# Patient Record
Sex: Male | Born: 1960 | Race: White | Hispanic: No | Marital: Married | Smoking: Never smoker
Health system: Southern US, Community
[De-identification: ages and names within clinical notes are randomized; demographics above are authoritative.]

## PROBLEM LIST (undated history)

## (undated) DIAGNOSIS — M549 Dorsalgia, unspecified: Secondary | ICD-10-CM

## (undated) DIAGNOSIS — G8929 Other chronic pain: Secondary | ICD-10-CM

## (undated) DIAGNOSIS — K219 Gastro-esophageal reflux disease without esophagitis: Secondary | ICD-10-CM

## (undated) DIAGNOSIS — M1990 Unspecified osteoarthritis, unspecified site: Secondary | ICD-10-CM

## (undated) DIAGNOSIS — R0602 Shortness of breath: Secondary | ICD-10-CM

## (undated) DIAGNOSIS — M5430 Sciatica, unspecified side: Secondary | ICD-10-CM

## (undated) DIAGNOSIS — R12 Heartburn: Secondary | ICD-10-CM

## (undated) DIAGNOSIS — I10 Essential (primary) hypertension: Secondary | ICD-10-CM

## (undated) DIAGNOSIS — I319 Disease of pericardium, unspecified: Secondary | ICD-10-CM

## (undated) DIAGNOSIS — E7800 Pure hypercholesterolemia, unspecified: Secondary | ICD-10-CM

## (undated) DIAGNOSIS — R7303 Prediabetes: Secondary | ICD-10-CM

## (undated) DIAGNOSIS — E663 Overweight: Secondary | ICD-10-CM

## (undated) DIAGNOSIS — M25559 Pain in unspecified hip: Secondary | ICD-10-CM

## (undated) DIAGNOSIS — M2550 Pain in unspecified joint: Secondary | ICD-10-CM

## (undated) DIAGNOSIS — F909 Attention-deficit hyperactivity disorder, unspecified type: Secondary | ICD-10-CM

## (undated) HISTORY — DX: Unspecified osteoarthritis, unspecified site: M19.90

## (undated) HISTORY — DX: Prediabetes: R73.03

## (undated) HISTORY — DX: Shortness of breath: R06.02

## (undated) HISTORY — DX: Dorsalgia, unspecified: M54.9

## (undated) HISTORY — DX: Overweight: E66.3

## (undated) HISTORY — PX: OTHER SURGICAL HISTORY: SHX169

## (undated) HISTORY — PX: UVULECTOMY: SHX2631

## (undated) HISTORY — DX: Attention-deficit hyperactivity disorder, unspecified type: F90.9

## (undated) HISTORY — DX: Heartburn: R12

## (undated) HISTORY — DX: Pain in unspecified hip: M25.559

## (undated) HISTORY — DX: Pain in unspecified joint: M25.50

---
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
## 1979-04-24 HISTORY — PX: WISDOM TOOTH EXTRACTION: SHX21

## 1998-04-23 DIAGNOSIS — I319 Disease of pericardium, unspecified: Secondary | ICD-10-CM

## 1998-04-23 DIAGNOSIS — Z8679 Personal history of other diseases of the circulatory system: Secondary | ICD-10-CM

## 1998-04-23 HISTORY — DX: Personal history of other diseases of the circulatory system: Z86.79

## 1998-04-23 HISTORY — DX: Disease of pericardium, unspecified: I31.9

## 2012-01-16 ENCOUNTER — Other Ambulatory Visit (HOSPITAL_BASED_OUTPATIENT_CLINIC_OR_DEPARTMENT_OTHER): Payer: Self-pay | Admitting: Family Medicine

## 2012-01-16 DIAGNOSIS — M5430 Sciatica, unspecified side: Secondary | ICD-10-CM

## 2012-01-18 ENCOUNTER — Other Ambulatory Visit (HOSPITAL_BASED_OUTPATIENT_CLINIC_OR_DEPARTMENT_OTHER): Payer: Self-pay | Admitting: Family Medicine

## 2012-01-18 DIAGNOSIS — Z1389 Encounter for screening for other disorder: Secondary | ICD-10-CM

## 2012-01-19 ENCOUNTER — Ambulatory Visit (HOSPITAL_BASED_OUTPATIENT_CLINIC_OR_DEPARTMENT_OTHER)
Admission: RE | Admit: 2012-01-19 | Discharge: 2012-01-19 | Disposition: A | Discharge status: Home / Self Care | Payer: Managed Care, Other (non HMO) | Source: Ambulatory Visit | Attending: Family Medicine | Admitting: Family Medicine

## 2012-01-19 DIAGNOSIS — Z1389 Encounter for screening for other disorder: Secondary | ICD-10-CM

## 2012-01-19 DIAGNOSIS — M5430 Sciatica, unspecified side: Secondary | ICD-10-CM

## 2012-01-19 DIAGNOSIS — M5126 Other intervertebral disc displacement, lumbar region: Secondary | ICD-10-CM | POA: Insufficient documentation

## 2015-10-28 ENCOUNTER — Other Ambulatory Visit: Payer: Self-pay | Admitting: Sports Medicine

## 2015-10-28 DIAGNOSIS — M25512 Pain in left shoulder: Principal | ICD-10-CM

## 2015-10-28 DIAGNOSIS — M25511 Pain in right shoulder: Secondary | ICD-10-CM

## 2016-04-23 HISTORY — PX: NASAL SINUS SURGERY: SHX719

## 2017-04-23 HISTORY — PX: COLONOSCOPY: SHX174

## 2017-05-28 HISTORY — PX: ROTATOR CUFF REPAIR: SHX139

## 2017-06-10 ENCOUNTER — Other Ambulatory Visit: Payer: Self-pay

## 2017-06-10 ENCOUNTER — Emergency Department (HOSPITAL_BASED_OUTPATIENT_CLINIC_OR_DEPARTMENT_OTHER): Payer: Commercial Managed Care - PPO

## 2017-06-10 ENCOUNTER — Encounter (HOSPITAL_BASED_OUTPATIENT_CLINIC_OR_DEPARTMENT_OTHER): Payer: Self-pay

## 2017-06-10 ENCOUNTER — Emergency Department (HOSPITAL_BASED_OUTPATIENT_CLINIC_OR_DEPARTMENT_OTHER)
Admission: EM | Admit: 2017-06-10 | Discharge: 2017-06-10 | Discharge status: Against Medical Advice | Payer: Commercial Managed Care - PPO | Attending: Emergency Medicine | Admitting: Emergency Medicine

## 2017-06-10 DIAGNOSIS — R0602 Shortness of breath: Secondary | ICD-10-CM | POA: Diagnosis not present

## 2017-06-10 DIAGNOSIS — R079 Chest pain, unspecified: Secondary | ICD-10-CM | POA: Insufficient documentation

## 2017-06-10 DIAGNOSIS — Z79899 Other long term (current) drug therapy: Secondary | ICD-10-CM | POA: Diagnosis not present

## 2017-06-10 HISTORY — DX: Sciatica, unspecified side: M54.30

## 2017-06-10 HISTORY — DX: Pure hypercholesterolemia, unspecified: E78.00

## 2017-06-10 HISTORY — DX: Gastro-esophageal reflux disease without esophagitis: K21.9

## 2017-06-10 HISTORY — DX: Disease of pericardium, unspecified: I31.9

## 2017-06-10 LAB — BASIC METABOLIC PANEL
Anion gap: 8 (ref 5–15)
BUN: 18 mg/dL (ref 6–20)
CALCIUM: 8.8 mg/dL — AB (ref 8.9–10.3)
CHLORIDE: 103 mmol/L (ref 101–111)
CO2: 30 mmol/L (ref 22–32)
CREATININE: 1.1 mg/dL (ref 0.61–1.24)
GFR calc non Af Amer: >60 mL/min (ref >60)
Glucose, Bld: 71 mg/dL (ref 65–99)
Potassium: 3.8 mmol/L (ref 3.5–5.1)
SODIUM: 141 mmol/L (ref 135–145)

## 2017-06-10 LAB — CBC
HCT: 43.7 % (ref 39.0–52.0)
Hemoglobin: 14.9 g/dL (ref 13.0–17.0)
MCH: 29.7 pg (ref 26.0–34.0)
MCHC: 34.1 g/dL (ref 30.0–36.0)
MCV: 87.2 fL (ref 78.0–100.0)
PLATELETS: 158 10*3/uL (ref 150–400)
RBC: 5.01 MIL/uL (ref 4.22–5.81)
RDW: 12.5 % (ref 11.5–15.5)
WBC: 5.9 10*3/uL (ref 4.0–10.5)

## 2017-06-10 LAB — D-DIMER, QUANTITATIVE: D-Dimer, Quant: 0.62 ug/mL-FEU — ABNORMAL HIGH (ref 0.00–0.50)

## 2017-06-10 LAB — TROPONIN I: TROPONIN I: 0.03 ng/mL — AB (ref <0.03)

## 2017-06-10 MED ORDER — IOPAMIDOL (ISOVUE-370) INJECTION 76%
100.0000 mL | Freq: Once | INTRAVENOUS | Status: AC | PRN
Start: 2017-06-10 — End: 2017-06-10
  Administered 2017-06-10: 100 mL via INTRAVENOUS

## 2017-06-10 NOTE — ED Notes (Signed)
Date and time results received: 06/10/17 1939   Test: troponin Critical Value: 0.03  Name of Provider Notified: Gwendalyn EgeLindsay Layden PA  Orders Received? Or Actions Taken?: no further orders received

## 2017-06-10 NOTE — ED Notes (Signed)
Pt and family member left 

## 2017-06-10 NOTE — ED Notes (Signed)
ED Provider at bedside. 

## 2017-06-10 NOTE — ED Notes (Signed)
Patient stated that after right ORIF last Friday, he feels uncomfortable on his chest when he is laying down at night.  He feels okay when just sitting up.

## 2017-06-10 NOTE — ED Notes (Signed)
Informed by AD, Crystal that patient left AMA.  I was with the patient and wife at bedside to get the order for Troponin and they never mentioned that they want to go home.

## 2017-06-10 NOTE — ED Provider Notes (Signed)
[facility] EMERGENCY DEPARTMENT Provider Note   CSN: [id number] Arrival date & time: 06/10/17  1502     History   Chief Complaint Chief Complaint  Patient presents with  . Chest Pain    HPI Donald Hubbard is a 57 y.o. male possible history of GERD, hypercholesterolemia, pericarditis who presents for evaluation of left-sided chest discomfort that has been ongoing for the last 3 days.  Patient reports that it is a discomfort in the left side of his chest that does not radiate.  He states that he feels like he has to press down on it to make it better.  He states that the discomfort is worse with deep inspiration and feels like "he cannot get enough breath in."  He states pain is worsened with sitting down and improved with sitting up. He states that the pain is not worse with exertion.  He has not had any associated nausea, diaphoresis.  He states that he has been unable to sleep secondary to the pain.  The only medication he has taken is some diazepam which he states has not helped that much.  Patient reports that he had rotator cuff surgery 4 days ago prior to onset of symptoms.  Patient patient denies any history of blood clots.  Patient denies any fevers, abdominal pain, vomiting, leg swelling.  Patient reports no personal cardiac history.  He does not have any high blood pressure or diabetes.  He does have high cholesterol.  He reports that both of his grandparents died of heart disease in their 59s and 43s.  Patient is on a current smoker.  The history is provided by the patient.    Past Medical History:  Diagnosis Date  . GERD (gastroesophageal reflux disease)   . High cholesterol   . Pericarditis   . Sciatic nerve pain     There are no active problems to display for this patient.   Past Surgical History:  Procedure Laterality Date  . ROTATOR CUFF REPAIR      Home Medications    Prior to Admission medications   Medication Sig Start Date End Date Taking?  Authorizing Provider  DiazePAM (VALIUM PO) Take by mouth.   Yes [provider]  HYDROcodone-acetaminophen (NORCO/VICODIN) 5-325 MG tablet Take 1 tablet by mouth every 6 (six) hours as needed for moderate pain.   Yes [provider]  OMEPRAZOLE PO Take by mouth.   Yes [provider]    Family History No family history on file.  Social History Social History   Tobacco Use  . Smoking status: Never Smoker  . Smokeless tobacco: Never Used  Substance Use Topics  . Alcohol use: Yes    Comment: daily  . Drug use: No     Allergies   Patient has no known allergies.   Review of Systems Review of Systems  Constitutional: Negative for fever.  Respiratory: Positive for shortness of breath. Negative for cough.   Cardiovascular: Positive for chest pain. Negative for leg swelling.  Gastrointestinal: Negative for abdominal pain, diarrhea, nausea and vomiting.  Genitourinary: Negative for dysuria and hematuria.  Musculoskeletal: Negative for back pain and neck pain.  Skin: Negative for color change and rash.  Neurological: Negative for dizziness, weakness, numbness and headaches.  All other systems reviewed and are negative.    Physical Exam Updated Vital Signs BP (!) 157/78 (BP Location: Left Arm)   Pulse (!) 57   Temp 98.5 F (36.9 C) (Oral)   Resp 18  Ht 5\' 7"  (1.702 m)   Wt 73.5 kg (162 lb)   SpO2 100%   BMI 25.37 kg/m   Physical Exam  Constitutional: He is oriented to person, place, and time. He appears well-developed and well-nourished.  HENT:  Head: Normocephalic and atraumatic.  Mouth/Throat: Oropharynx is clear and moist and mucous membranes are normal.  Eyes: Conjunctivae, EOM and lids are normal. Pupils are equal, round, and reactive to light.  Neck: Full passive range of motion without pain.  Cardiovascular: Normal rate, regular rhythm, normal heart sounds and normal pulses. Exam reveals no gallop and no friction rub.  No murmur  heard. Pulses:      Radial pulses are 2+ on the right side, and 2+ on the left side.  Pulmonary/Chest: Effort normal and breath sounds normal.  No evidence of respiratory distress. Able to speak in full sentences without difficulty.  No tenderness palpation to the anterior chest wall.  Pain is not repeated use with palpation or movement of the upper extremities.  Abdominal: Soft. Normal appearance. There is no tenderness. There is no rigidity and no guarding.  Musculoskeletal: Normal range of motion.  Surgical incision site noted to the right shoulder.  No surrounding warmth, erythema, active drainage.  Neurological: He is alert and oriented to person, place, and time.  Skin: Skin is warm and dry. Capillary refill takes less than 2 seconds.  Psychiatric: He has a normal mood and affect. His speech is normal.  Nursing note and vitals reviewed.    ED Treatments / Results  Labs (all labs ordered are listed, but only abnormal results are displayed) Labs Reviewed  BASIC METABOLIC PANEL - Abnormal; Notable for the following components:      Result Value   Calcium 8.8 (*)    All other components within normal limits  D-DIMER, QUANTITATIVE (NOT AT [facility]) - Abnormal; Notable for the following components:   D-Dimer, Quant 0.62 (*)    All other components within normal limits  TROPONIN I - Abnormal; Notable for the following components:   Troponin I 0.03 (*)    All other components within normal limits  CBC  TROPONIN I    EKG  EKG Interpretation  Date/Time:  Monday June 10 2017 15:17:28 EST Ventricular Rate:  61 PR Interval:  134 QRS Duration: 86 QT Interval:  434 QTC Calculation: 436 R Axis:   47 Text Interpretation:  Normal sinus rhythm Normal ECG No prior ECG for comparison.  NO STEMI Confirmed by Theda Belfast ([id number]) on 06/10/2017 7:26:01 PM       Radiology Dg Chest 2 View  Result Date: 06/10/2017 CLINICAL DATA:  Pt had right shoulder surgery 4 days ago, unable to move  arm away from body much for lateral view. Pt has been having chest pressure to central and left side of chest since surgery. H/o pericarditis, GERD. EXAM: CHEST  2 VIEW COMPARISON:  None. FINDINGS: The heart size and mediastinal contours are within normal limits. Both lungs are clear. The visualized skeletal structures are unremarkable. IMPRESSION: No active cardiopulmonary disease. Electronically Signed   By: Norva Pavlov M.D.   On: 06/10/2017 15:38   Ct Angio Chest Pe W Or Wo Contrast  Result Date: 06/10/2017 CLINICAL DATA:  History of right shoulder surgery 4 days ago. Chest pain since surgery. EXAM: CT ANGIOGRAPHY CHEST WITH CONTRAST TECHNIQUE: Multidetector CT imaging of the chest was performed using the standard protocol during bolus administration of intravenous contrast. Multiplanar CT image reconstructions and MIPs were obtained to  evaluate the vascular anatomy. CONTRAST:  ISOVUE-370 IOPAMIDOL (ISOVUE-370) INJECTION 76% COMPARISON:  Chest x-ray 06/10/2017 FINDINGS: Cardiovascular: The heart is normal in size. No pericardial effusion. The aorta is normal in caliber. No dissection. No atherosclerotic calcifications. No definite coronary artery calcifications. The pulmonary arterial tree is well opacified. No filling defects to suggest pulmonary embolism. Mediastinum/Nodes: No mediastinal or hilar mass or lymphadenopathy. Small scattered lymph nodes are noted. The esophagus is grossly normal. Lungs/Pleura: The lungs are clear. No infiltrates, edema or effusions. No pneumothorax. No interstitial lung disease or bronchiectasis. Upper Abdomen: No significant upper abdominal findings. Musculoskeletal: No chest wall lesions. No supraclavicular or axillary adenopathy. Small amount of gas is noted in the right subacromial bursa, around the biceps tendon and in the joint consistent with recent surgery. Review of the MIP images confirms the above findings. IMPRESSION: 1. No CT findings for pulmonary  embolism. 2. Normal thoracic aorta. 3. No acute pulmonary findings. 4. Postoperative changes involving the right shoulder. Electronically Signed   By: Rudie Meyer M.D.   On: 06/10/2017 18:20    Procedures Procedures (including critical care time)  Medications Ordered in ED Medications  iopamidol (ISOVUE-370) 76 % injection 100 mL (100 mLs Intravenous Contrast Given 06/10/17 1742)     Initial Impression / Assessment and Plan / ED Course  I have reviewed the triage vital signs and the nursing notes.  Pertinent labs & imaging results that were available during my care of the patient were reviewed by me and considered in my medical decision making (see chart for details).     57 year old male who presents for evaluation of chest pain that has been ongoing for the last 3 days.  He is status post rotator cuff surgery 4 days ago prior to onset of symptoms.  Pain is worse with deep inspiration and he feels like he cannot get enough breath in.  He denies any exertional component to the chest pain.  Denies any nausea, fever, diaphoresis.  No personal cardiac history.  Does have family members who died of heart disease in their 56s and 25s.  He does have a history of high cholesterol.  No hypertension, diabetes. Patient is afebrile, non-toxic appearing, sitting comfortably on examination table. Vital signs reviewed and stable.  Consider PE versus ACS etiology versus acute infectious etiology versus symptomatic anemia vs pericarditis. Initial labs ordered at triage.  Initial troponin is negative.  CBC shows stable hemoglobin and hematocrit.  BMP is unremarkable.  D-dimer is elevated.  Given positive d-dimer, will plan for CTA of chest for evaluation of PE.  Chest x-ray negative for any acute infectious etiology.  EKG shows normal sinus rhythm, rate 61.  No previous for comparisons.  Given patient's history and risk factors, he has a HEART score of 2 and is therefore low risk for acute cardiac event. Plan  for delta troponin.   CTA of chest is negative for any acute pulmonary embolism.  Delta troponin pending.  Updated patient and family on plan.  We will plan to repeat troponin at 3-hour mark.  Patient reports pain is improved in the ED.    Family requesting update from Diplomatic Services operational officer. Explained to family that delta will discuss treatment options with patient.  If patient wishes troponin is pending. Will update when results are back.   Delta troponin is 0.03.  His initial troponin had been at less than 0.03.   Went to patient's room to update him on delta troponin.  Patient was out in the room.  Secretary saw patient and family walk out of the room prior to discussion of results.   Final Clinical Impressions(s) / ED Diagnoses   Final diagnoses:  Chest pain, unspecified type    ED Discharge Orders    None       Rosana HoesLayden, Lindsey A, PA-C 06/11/17 2135    Tegeler, Canary Brimhristopher J, MD 06/12/17 [phone]

## 2017-06-10 NOTE — ED Triage Notes (Signed)
C/o left side CP started after right rotator cuff surgery 2.15-NAD-steady gait-has right shoulder immobilizer in place

## 2018-04-23 HISTORY — PX: BREAST LUMPECTOMY: SHX2

## 2018-07-29 DIAGNOSIS — M5030 Other cervical disc degeneration, unspecified cervical region: Secondary | ICD-10-CM | POA: Diagnosis not present

## 2018-07-29 DIAGNOSIS — M5033 Other cervical disc degeneration, cervicothoracic region: Secondary | ICD-10-CM | POA: Diagnosis not present

## 2019-09-23 ENCOUNTER — Other Ambulatory Visit: Payer: Self-pay | Admitting: Family Medicine

## 2019-09-23 DIAGNOSIS — N63 Unspecified lump in unspecified breast: Secondary | ICD-10-CM

## 2019-09-30 ENCOUNTER — Ambulatory Visit: Payer: Commercial Managed Care - PPO

## 2019-09-30 ENCOUNTER — Other Ambulatory Visit: Payer: Self-pay

## 2019-09-30 ENCOUNTER — Ambulatory Visit
Admission: RE | Admit: 2019-09-30 | Discharge: 2019-09-30 | Disposition: A | Discharge status: Home / Self Care | Payer: Commercial Managed Care - PPO | Source: Ambulatory Visit | Attending: Family Medicine | Admitting: Family Medicine

## 2019-09-30 DIAGNOSIS — N63 Unspecified lump in unspecified breast: Secondary | ICD-10-CM

## 2019-10-23 ENCOUNTER — Ambulatory Visit: Payer: Self-pay | Admitting: Surgery

## 2019-10-23 NOTE — H&P (Signed)
History of Present Illness Donald Hubbard. Donald Keimig MD; 10/23/2019 12:38 PM) The patient is a 59 year old male who presents with a complaint of Mass. This is a healthy 59 year old male who presents with a 2 year history of slowly enlarging tender left breast mass. The patient states that he has intermittently noticed some tenderness in the left breast in the upper outer quadrant. This has become more noticeable more severe recently. He can palpate a firm tender mass in this area. He underwent mammogram that was unremarkable. No biopsies were performed. Due to the ongoing tenderness and palpable mass, he presents now to discuss excision.  No family history of breast cancer.  CLINICAL DATA: 59 year old male with chronic fullness and pain in the RETROAREOLAR LEFT breast, recently increased.  EXAM: DIGITAL DIAGNOSTIC BILATERAL MAMMOGRAM WITH CAD AND TOMO  COMPARISON: None  ACR Breast Density Category b: There are scattered areas of fibroglandular density.  FINDINGS: 2D/3D full field views of both breasts and spot compression view of the LEFT breast demonstrate a moderate amount of normal fibroglandular tissue in the RETROAREOLAR LEFT breast compatible with gynecomastia.  Minimal gynecomastia in the RETROAREOLAR RIGHT breast noted.  No suspicious mass, distortion or worrisome calcifications are noted.  Mammographic images were processed with CAD.  On physical examination, fullness in the LEFT RETROAREOLAR region identified without discrete palpable mass.  IMPRESSION: 1. Moderate LEFT gynecomastia and minimal RIGHT gynecomastia. 2. No mammographic evidence of breast malignancy.  RECOMMENDATION: Surgical consultation as the patient desires possible excision due to pain and fullness.  I have discussed the findings and recommendations with the patient. If applicable, a reminder letter will be sent to the patient regarding the next appointment.  BI-RADS CATEGORY 2:  Benign.  Electronically Signed: By: Donald Hubbard M.D. On: 09/30/2019 10:45   Problem List/Past Medical Donald Hubbard K. Bibiana Gillean, MD; 10/23/2019 12:38 PM) LEFT BREAST MASS (N63.20) BREAST TENDERNESS IN MALE (N64.4)  Past Surgical History Donald Hubbard, Donald Hubbard; 10/23/2019 9:21 AM) Colon Polyp Removal - Colonoscopy Shoulder Surgery Right.  Allergies Donald Hubbard, Donald Hubbard; 10/23/2019 9:22 AM) No Known Drug Allergies [10/23/2019]:  Medication History Donald Hubbard, Hubbard; 10/23/2019 9:24 AM) Acyclovir (800MG  Tablet, Oral) Active. Ezetimibe (10MG  Tablet, Oral) Active. ALPRAZolam (0.5MG  Tablet, Oral) Active. Lansoprazole (30MG  Capsule DR, Oral) Active. Adderall XR (10MG  Capsule ER 24HR, Oral) Active. HYDROcodone-Acetaminophen (5-325MG  Tablet, Oral) Active. diazePAM (5MG  Tablet, Oral) Active.  Social History [place], ; 10/23/2019 9:21 AM) Alcohol use Moderate alcohol use. Caffeine use Coffee. No drug use  Family History , Donald Hubbard; 10/23/2019 9:21 AM) Hypertension Mother.  Other Problems Donald Hubbard. Donald Imhof, MD; 10/23/2019 12:38 PM) Arthritis Back Pain Gastroesophageal Reflux Disease High blood pressure Hypercholesterolemia     Review of Systems Donald Hubbard Donald Hubbard; 10/23/2019 9:21 AM) General Not Present- Appetite Loss, Chills, Fatigue, Fever, Night Sweats, Weight Gain and Weight Loss. Skin Not Present- Change in Wart/Mole, Dryness, Hives, Jaundice, New Lesions, Non-Healing Wounds, Rash and Ulcer. HEENT Not Present- Earache, Hearing Loss, Hoarseness, Nose Bleed, Oral Ulcers, Ringing in the Ears, Seasonal Allergies, Sinus Pain, Sore Throat, Visual Disturbances, Wears glasses/contact lenses and Yellow Eyes. Respiratory Not Present- Bloody sputum, Chronic Cough, Difficulty Breathing, Snoring and Wheezing. Breast Present- Breast Mass. Not Present- Breast Pain, Nipple Discharge and Skin Changes. Cardiovascular Not Present- Chest Pain, Difficulty Breathing Lying Down,  Leg Cramps, Palpitations, Rapid Heart Rate, Shortness of Breath and Swelling of Extremities. Gastrointestinal Not Present- Abdominal Pain, Bloating, Bloody Stool, Change in Bowel Habits, Chronic diarrhea, Constipation, Difficulty Swallowing, Excessive gas, Gets full quickly at  meals, Hemorrhoids, Indigestion, Nausea, Rectal Pain and Vomiting. Male Genitourinary Not Present- Blood in Urine, Change in Urinary Stream, Frequency, Impotence, Nocturia, Painful Urination, Urgency and Urine Leakage. Musculoskeletal Not Present- Back Pain, Joint Pain, Joint Stiffness, Muscle Pain, Muscle Weakness and Swelling of Extremities. Neurological Not Present- Decreased Memory, Fainting, Headaches, Numbness, Seizures, Tingling, Tremor, Trouble walking and Weakness. Psychiatric Not Present- Anxiety, Bipolar, Change in Sleep Pattern, Depression, Fearful and Frequent crying. Endocrine Not Present- Cold Intolerance, Excessive Hunger, Hair Changes, Heat Intolerance, Hot flashes and New Diabetes. Hematology Not Present- Blood Thinners, Easy Bruising, Excessive bleeding, Gland problems, HIV and Persistent Infections.  Vitals Donald Hubbard Donald Hubbard; 10/23/2019 9:24 AM) 10/23/2019 9:24 AM Weight: 167.38 lb Height: 67in Body Surface Area: 1.87 m Body Mass Index: 26.21 kg/m  Temp.: 98.48F  Pulse: 77 (Regular)  BP: 134/80(Sitting, Left Arm, Standard)        Physical Exam Donald Hubbard K. Donald Bier MD; 10/23/2019 12:39 PM)  The physical exam findings are as follows: Note:Constitutional: WDWN in NAD, conversant, no obvious deformities; resting comfortably Eyes: Pupils equal, round; sclera anicteric; moist conjunctiva; no lid lag HENT: Oral mucosa moist; good dentition Neck: No masses palpated, trachea midline; no thyromegaly Lungs: CTA bilaterally; normal respiratory effort Breasts: Minimal asymmetry with the left side slightly greater than the right. However beginning in the left upper lateral retroareolar region  extending up into the upper outer quadrant, there is a area of firmness that measures about 3 cm in diameter. This appears to be about a centimeter or 1.5 cm in thickness. This areas moderately tender to palpation. There are no overlying skin changes. No drainage from the nipple. CV: Regular rate and rhythm; no murmurs; extremities well-perfused with no edema Abd: +bowel sounds, soft, non-tender, no palpable organomegaly; no palpable hernias Musc: Normal gait; no apparent clubbing or cyanosis in extremities Lymphatic: No palpable cervical or axillary lymphadenopathy Skin: Warm, dry; no sign of jaundice Psychiatric - alert and oriented x 4; calm mood and affect    Assessment & Plan Donald Hubbard K. Alee Gressman MD; 10/23/2019 12:37 PM)  LEFT BREAST MASS (N63.20)   BREAST TENDERNESS IN MALE (N64.4)  Current Plans Schedule for Surgery - Left subcutaneous mastectomy. The surgical procedure has been discussed with the patient. Potential risks, benefits, alternative treatments, and expected outcomes have been explained. All of the patient's questions at this time have been answered. The likelihood of reaching the patient's treatment goal is good. The patient understand the proposed surgical procedure and wishes to proceed.  Donald Hubbard. Donald Skains, MD, [facility] Surgery  General/ Trauma Surgery   10/23/2019 12:40 PM

## 2019-11-25 ENCOUNTER — Other Ambulatory Visit: Payer: Self-pay

## 2019-11-25 ENCOUNTER — Encounter (HOSPITAL_BASED_OUTPATIENT_CLINIC_OR_DEPARTMENT_OTHER): Payer: Self-pay | Admitting: Surgery

## 2019-11-25 NOTE — Progress Notes (Signed)
Chart reviewed with Dr. Hyacinth Meeker, ok to proceed as scheduled.

## 2019-11-30 ENCOUNTER — Other Ambulatory Visit (HOSPITAL_COMMUNITY)
Admission: RE | Admit: 2019-11-30 | Discharge: 2019-11-30 | Disposition: A | Discharge status: Home / Self Care | Payer: Commercial Managed Care - PPO | Source: Ambulatory Visit | Attending: Surgery | Admitting: Surgery

## 2019-11-30 DIAGNOSIS — Z01812 Encounter for preprocedural laboratory examination: Secondary | ICD-10-CM | POA: Diagnosis present

## 2019-11-30 DIAGNOSIS — Z20822 Contact with and (suspected) exposure to covid-19: Secondary | ICD-10-CM | POA: Insufficient documentation

## 2019-11-30 LAB — SARS CORONAVIRUS 2 (TAT 6-24 HRS): SARS Coronavirus 2: NEGATIVE

## 2019-11-30 NOTE — Progress Notes (Signed)

## 2019-12-03 ENCOUNTER — Encounter (HOSPITAL_BASED_OUTPATIENT_CLINIC_OR_DEPARTMENT_OTHER): Payer: Self-pay | Admitting: Surgery

## 2019-12-03 ENCOUNTER — Other Ambulatory Visit: Payer: Self-pay

## 2019-12-03 ENCOUNTER — Ambulatory Visit (HOSPITAL_BASED_OUTPATIENT_CLINIC_OR_DEPARTMENT_OTHER): Payer: Commercial Managed Care - PPO | Admitting: Anesthesiology

## 2019-12-03 ENCOUNTER — Encounter (HOSPITAL_BASED_OUTPATIENT_CLINIC_OR_DEPARTMENT_OTHER)
Admission: RE | Disposition: A | Discharge status: Home / Self Care | Payer: Self-pay | Source: Home / Self Care | Attending: Surgery

## 2019-12-03 ENCOUNTER — Ambulatory Visit (HOSPITAL_BASED_OUTPATIENT_CLINIC_OR_DEPARTMENT_OTHER)
Admission: RE | Admit: 2019-12-03 | Discharge: 2019-12-03 | Disposition: A | Discharge status: Home / Self Care | Payer: Commercial Managed Care - PPO | Attending: Surgery | Admitting: Surgery

## 2019-12-03 DIAGNOSIS — K219 Gastro-esophageal reflux disease without esophagitis: Secondary | ICD-10-CM | POA: Diagnosis not present

## 2019-12-03 DIAGNOSIS — I10 Essential (primary) hypertension: Secondary | ICD-10-CM | POA: Insufficient documentation

## 2019-12-03 DIAGNOSIS — N6321 Unspecified lump in the left breast, upper outer quadrant: Secondary | ICD-10-CM | POA: Diagnosis not present

## 2019-12-03 DIAGNOSIS — Z79899 Other long term (current) drug therapy: Secondary | ICD-10-CM | POA: Diagnosis not present

## 2019-12-03 DIAGNOSIS — N6320 Unspecified lump in the left breast, unspecified quadrant: Secondary | ICD-10-CM | POA: Diagnosis present

## 2019-12-03 DIAGNOSIS — Z79891 Long term (current) use of opiate analgesic: Secondary | ICD-10-CM | POA: Diagnosis not present

## 2019-12-03 DIAGNOSIS — N62 Hypertrophy of breast: Secondary | ICD-10-CM | POA: Insufficient documentation

## 2019-12-03 HISTORY — PX: BREAST BIOPSY: SHX20

## 2019-12-03 HISTORY — DX: Essential (primary) hypertension: I10

## 2019-12-03 HISTORY — DX: Other chronic pain: G89.29

## 2019-12-03 SURGERY — BREAST BIOPSY
Anesthesia: General | Site: Breast | Laterality: Left

## 2019-12-03 MED ORDER — HYDROMORPHONE HCL 1 MG/ML IJ SOLN
0.2500 mg | INTRAMUSCULAR | Status: DC | PRN
  Administered 2019-12-03: 0.25 mg via INTRAVENOUS

## 2019-12-03 MED ORDER — 0.9 % SODIUM CHLORIDE (POUR BTL) OPTIME
TOPICAL | Status: DC | PRN
  Administered 2019-12-03: 200 mL

## 2019-12-03 MED ORDER — GABAPENTIN 300 MG PO CAPS
ORAL_CAPSULE | ORAL | Status: AC
  Filled 2019-12-03: qty 1

## 2019-12-03 MED ORDER — PROPOFOL 10 MG/ML IV BOLUS
INTRAVENOUS | Status: DC | PRN
  Administered 2019-12-03: 150 mg via INTRAVENOUS

## 2019-12-03 MED ORDER — ACETAMINOPHEN 500 MG PO TABS
ORAL_TABLET | ORAL | Status: AC
  Filled 2019-12-03: qty 2

## 2019-12-03 MED ORDER — OXYCODONE HCL 5 MG/5ML PO SOLN: 5.0000 mg | Freq: Once | ORAL | Status: AC | PRN

## 2019-12-03 MED ORDER — OXYCODONE HCL 5 MG PO TABS
5.0000 mg | ORAL_TABLET | Freq: Once | ORAL | Status: AC | PRN
  Administered 2019-12-03: 5 mg via ORAL

## 2019-12-03 MED ORDER — CEFAZOLIN SODIUM-DEXTROSE 2-4 GM/100ML-% IV SOLN
2.0000 g | INTRAVENOUS | Status: AC
  Administered 2019-12-03: 2 g via INTRAVENOUS

## 2019-12-03 MED ORDER — FENTANYL CITRATE (PF) 100 MCG/2ML IJ SOLN
100.0000 ug | Freq: Once | INTRAMUSCULAR | Status: AC
  Administered 2019-12-03: 100 ug via INTRAVENOUS

## 2019-12-03 MED ORDER — DEXAMETHASONE SODIUM PHOSPHATE 10 MG/ML IJ SOLN
INTRAMUSCULAR | Status: DC | PRN
  Administered 2019-12-03: 10 mg via INTRAVENOUS

## 2019-12-03 MED ORDER — OXYCODONE HCL 5 MG PO TABS
5.0000 mg | ORAL_TABLET | Freq: Four times a day (QID) | ORAL | 0 refills | Status: DC | PRN
Start: 2019-12-03 — End: 2021-07-20

## 2019-12-03 MED ORDER — LACTATED RINGERS IV SOLN: INTRAVENOUS | Status: DC

## 2019-12-03 MED ORDER — MIDAZOLAM HCL 2 MG/2ML IJ SOLN
INTRAMUSCULAR | Status: AC
  Filled 2019-12-03: qty 2

## 2019-12-03 MED ORDER — MIDAZOLAM HCL 2 MG/2ML IJ SOLN
2.0000 mg | Freq: Once | INTRAMUSCULAR | Status: AC
  Administered 2019-12-03: 2 mg via INTRAVENOUS

## 2019-12-03 MED ORDER — PROPOFOL 10 MG/ML IV BOLUS
INTRAVENOUS | Status: AC
  Filled 2019-12-03: qty 20

## 2019-12-03 MED ORDER — EPHEDRINE SULFATE 50 MG/ML IJ SOLN
INTRAMUSCULAR | Status: DC | PRN
  Administered 2019-12-03: 5 mg via INTRAVENOUS
  Administered 2019-12-03 (×2): 10 mg via INTRAVENOUS

## 2019-12-03 MED ORDER — BUPIVACAINE HCL (PF) 0.25 % IJ SOLN
INTRAMUSCULAR | Status: DC | PRN
  Administered 2019-12-03: 10 mL

## 2019-12-03 MED ORDER — EPHEDRINE 5 MG/ML INJ
INTRAVENOUS | Status: AC
  Filled 2019-12-03: qty 10

## 2019-12-03 MED ORDER — AMISULPRIDE (ANTIEMETIC) 5 MG/2ML IV SOLN: 10.0000 mg | Freq: Once | INTRAVENOUS | Status: DC | PRN

## 2019-12-03 MED ORDER — MEPERIDINE HCL 25 MG/ML IJ SOLN: 6.2500 mg | INTRAMUSCULAR | Status: DC | PRN

## 2019-12-03 MED ORDER — ROPIVACAINE HCL 5 MG/ML IJ SOLN
INTRAMUSCULAR | Status: DC | PRN
  Administered 2019-12-03: 30 mL via PERINEURAL

## 2019-12-03 MED ORDER — FENTANYL CITRATE (PF) 100 MCG/2ML IJ SOLN
INTRAMUSCULAR | Status: AC
  Filled 2019-12-03: qty 2

## 2019-12-03 MED ORDER — ONDANSETRON HCL 4 MG/2ML IJ SOLN
INTRAMUSCULAR | Status: DC | PRN
  Administered 2019-12-03: 4 mg via INTRAVENOUS

## 2019-12-03 MED ORDER — CEFAZOLIN SODIUM-DEXTROSE 2-4 GM/100ML-% IV SOLN
INTRAVENOUS | Status: AC
  Filled 2019-12-03: qty 100

## 2019-12-03 MED ORDER — MIDAZOLAM HCL 5 MG/5ML IJ SOLN
INTRAMUSCULAR | Status: DC | PRN
  Administered 2019-12-03: 2 mg via INTRAVENOUS

## 2019-12-03 MED ORDER — ACETAMINOPHEN 500 MG PO TABS
1000.0000 mg | ORAL_TABLET | ORAL | Status: AC
  Administered 2019-12-03: 1000 mg via ORAL

## 2019-12-03 MED ORDER — DEXAMETHASONE SODIUM PHOSPHATE 10 MG/ML IJ SOLN
INTRAMUSCULAR | Status: AC
  Filled 2019-12-03: qty 1

## 2019-12-03 MED ORDER — MIDAZOLAM HCL 2 MG/2ML IJ SOLN: 2.0000 mg | Freq: Once | INTRAMUSCULAR | Status: DC

## 2019-12-03 MED ORDER — ONDANSETRON HCL 4 MG/2ML IJ SOLN
INTRAMUSCULAR | Status: AC
  Filled 2019-12-03: qty 2

## 2019-12-03 MED ORDER — LIDOCAINE 2% (20 MG/ML) 5 ML SYRINGE
INTRAMUSCULAR | Status: AC
  Filled 2019-12-03: qty 5

## 2019-12-03 MED ORDER — CHLORHEXIDINE GLUCONATE CLOTH 2 % EX PADS: 6.0000 | MEDICATED_PAD | Freq: Once | CUTANEOUS | Status: DC

## 2019-12-03 MED ORDER — OXYCODONE HCL 5 MG PO TABS
ORAL_TABLET | ORAL | Status: AC
  Filled 2019-12-03: qty 1

## 2019-12-03 MED ORDER — PROMETHAZINE HCL 25 MG/ML IJ SOLN: 6.2500 mg | INTRAMUSCULAR | Status: DC | PRN

## 2019-12-03 MED ORDER — LIDOCAINE HCL (CARDIAC) PF 100 MG/5ML IV SOSY
PREFILLED_SYRINGE | INTRAVENOUS | Status: DC | PRN
  Administered 2019-12-03: 60 mg via INTRATRACHEAL

## 2019-12-03 MED ORDER — HYDROMORPHONE HCL 1 MG/ML IJ SOLN
INTRAMUSCULAR | Status: AC
  Filled 2019-12-03: qty 0.5

## 2019-12-03 MED ORDER — GABAPENTIN 300 MG PO CAPS
300.0000 mg | ORAL_CAPSULE | ORAL | Status: AC
  Administered 2019-12-03: 300 mg via ORAL

## 2019-12-03 MED ORDER — FENTANYL CITRATE (PF) 100 MCG/2ML IJ SOLN
INTRAMUSCULAR | Status: DC | PRN
  Administered 2019-12-03: 50 ug via INTRAVENOUS

## 2019-12-03 SURGICAL SUPPLY — 46 items
APL PRP STRL LF DISP 70% ISPRP (MISCELLANEOUS) ×1
APL SKNCLS STERI-STRIP NONHPOA (GAUZE/BANDAGES/DRESSINGS) ×1
BENZOIN TINCTURE PRP APPL 2/3 (GAUZE/BANDAGES/DRESSINGS) ×2 IMPLANT
BLADE HEX COATED 2.75 (ELECTRODE) IMPLANT
BLADE SURG 15 STRL LF DISP TIS (BLADE) ×1 IMPLANT
BLADE SURG 15 STRL SS (BLADE) ×2
CANISTER SUCT 1200ML W/VALVE (MISCELLANEOUS) ×2 IMPLANT
CHLORAPREP W/TINT 26 (MISCELLANEOUS) ×2 IMPLANT
COVER BACK TABLE 60X90IN (DRAPES) ×2 IMPLANT
COVER MAYO STAND STRL (DRAPES) ×2 IMPLANT
COVER WAND RF STERILE (DRAPES) IMPLANT
DECANTER SPIKE VIAL GLASS SM (MISCELLANEOUS) IMPLANT
DRAPE LAPAROTOMY 100X72 PEDS (DRAPES) ×2 IMPLANT
DRAPE UTILITY XL STRL (DRAPES) ×2 IMPLANT
DRSG TEGADERM 4X4.75 (GAUZE/BANDAGES/DRESSINGS) ×2 IMPLANT
ELECT REM PT RETURN 9FT ADLT (ELECTROSURGICAL) ×2
ELECTRODE REM PT RTRN 9FT ADLT (ELECTROSURGICAL) ×1 IMPLANT
GAUZE SPONGE 4X4 12PLY STRL LF (GAUZE/BANDAGES/DRESSINGS) ×2 IMPLANT
GLOVE BIO SURGEON STRL SZ 6.5 (GLOVE) ×2 IMPLANT
GLOVE BIO SURGEON STRL SZ7 (GLOVE) ×2 IMPLANT
GLOVE BIOGEL PI IND STRL 7.0 (GLOVE) ×1 IMPLANT
GLOVE BIOGEL PI IND STRL 7.5 (GLOVE) ×1 IMPLANT
GLOVE BIOGEL PI INDICATOR 7.0 (GLOVE) ×1
GLOVE BIOGEL PI INDICATOR 7.5 (GLOVE) ×1
GOWN STRL REUS W/ TWL LRG LVL3 (GOWN DISPOSABLE) ×2 IMPLANT
GOWN STRL REUS W/TWL LRG LVL3 (GOWN DISPOSABLE) ×4
ILLUMINATOR WAVEGUIDE N/F (MISCELLANEOUS) IMPLANT
KIT MARKER MARGIN INK (KITS) IMPLANT
LIGHT WAVEGUIDE WIDE FLAT (MISCELLANEOUS) IMPLANT
NEEDLE HYPO 25X1 1.5 SAFETY (NEEDLE) ×2 IMPLANT
NS IRRIG 1000ML POUR BTL (IV SOLUTION) ×2 IMPLANT
PACK BASIN DAY SURGERY FS (CUSTOM PROCEDURE TRAY) ×2 IMPLANT
PENCIL SMOKE EVACUATOR (MISCELLANEOUS) ×2 IMPLANT
SLEEVE SCD COMPRESS KNEE MED (MISCELLANEOUS) ×2 IMPLANT
SPONGE LAP 4X18 RFD (DISPOSABLE) ×2 IMPLANT
STRIP CLOSURE SKIN 1/2X4 (GAUZE/BANDAGES/DRESSINGS) ×2 IMPLANT
SUT CHROMIC 3 0 SH 27 (SUTURE) IMPLANT
SUT MON AB 4-0 PC3 18 (SUTURE) ×2 IMPLANT
SUT SILK 2 0 SH (SUTURE) IMPLANT
SUT VIC AB 3-0 SH 27 (SUTURE) ×2
SUT VIC AB 3-0 SH 27X BRD (SUTURE) ×1 IMPLANT
SYR CONTROL 10ML LL (SYRINGE) ×2 IMPLANT
TOWEL GREEN STERILE FF (TOWEL DISPOSABLE) ×2 IMPLANT
TRAY FAXITRON CT DISP (TRAY / TRAY PROCEDURE) IMPLANT
TUBE CONNECTING 20X1/4 (TUBING) ×2 IMPLANT
YANKAUER SUCT BULB TIP NO VENT (SUCTIONS) ×2 IMPLANT

## 2019-12-03 NOTE — Progress Notes (Signed)
Assisted Dr. Miller with left, ultrasound guided, pectoralis block. Side rails up, monitors on throughout procedure. See vital signs in flow sheet. Tolerated Procedure well. 

## 2019-12-03 NOTE — Anesthesia Procedure Notes (Signed)
Anesthesia Regional Block: Pectoralis block   Pre-Anesthetic Checklist: ,, timeout performed, Correct Patient, Correct Site, Correct Laterality, Correct Procedure, Correct Position, site marked, Risks and benefits discussed,  Surgical consent,  Pre-op evaluation,  At surgeon's request and post-op pain management  Laterality: Left  Prep: chloraprep       Needles:   Needle Type: Stimiplex     Needle Length: 9cm  Needle Gauge: 21     Additional Needles:   Procedures:,,,, ultrasound used (permanent image in chart),,,,  Narrative:  Start time: 12/03/2019 10:15 AM End time: 12/03/2019 10:20 AM Injection made incrementally with aspirations every 5 mL.  Performed by: Personally  Anesthesiologist: Lowella Curb, MD

## 2019-12-03 NOTE — Anesthesia Preprocedure Evaluation (Signed)
Anesthesia Evaluation  Patient identified by MRN, date of birth, ID band Patient awake    Reviewed: Allergy & Precautions, NPO status , Patient's Chart, lab work & pertinent test results  Airway Mallampati: II  TM Distance: >3 FB Neck ROM: Full    Dental no notable dental hx.    Pulmonary neg pulmonary ROS,    Pulmonary exam normal breath sounds clear to auscultation       Cardiovascular hypertension, Pt. on medications negative cardio ROS Normal cardiovascular exam Rhythm:Regular Rate:Normal     Neuro/Psych negative neurological ROS  negative psych ROS   GI/Hepatic Neg liver ROS, GERD  ,  Endo/Other  negative endocrine ROS  Renal/GU negative Renal ROS  negative genitourinary   Musculoskeletal negative musculoskeletal ROS (+)   Abdominal   Peds negative pediatric ROS (+)  Hematology negative hematology ROS (+)   Anesthesia Other Findings   Reproductive/Obstetrics negative OB ROS                             Anesthesia Physical Anesthesia Plan  ASA: II  Anesthesia Plan: General   Post-op Pain Management:  Regional for Post-op pain   Induction: Intravenous  PONV Risk Score and Plan: 2 and Ondansetron, Midazolam and Treatment may vary due to age or medical condition  Airway Management Planned: LMA  Additional Equipment:   Intra-op Plan:   Post-operative Plan: Extubation in OR  Informed Consent: I have reviewed the patients History and Physical, chart, labs and discussed the procedure including the risks, benefits and alternatives for the proposed anesthesia with the patient or authorized representative who has indicated his/her understanding and acceptance.     Dental advisory given  Plan Discussed with: CRNA  Anesthesia Plan Comments:         Anesthesia Quick Evaluation

## 2019-12-03 NOTE — H&P (Signed)
History of Present Illness  The patient is a 59 year old male who presents with a complaint of Mass. This is a healthy 59 year old male who presents with a 2 year history of slowly enlarging tender left breast mass. The patient states that he has intermittently noticed some tenderness in the left breast in the upper outer quadrant. This has become more noticeable more severe recently. He can palpate a firm tender mass in this area. He underwent mammogram that was unremarkable. No biopsies were performed. Due to the ongoing tenderness and palpable mass, he presents now to discuss excision.  No family history of breast cancer.  CLINICAL DATA: 59 year old male with chronic fullness and pain in the RETROAREOLAR LEFT breast, recently increased.  EXAM: DIGITAL DIAGNOSTIC BILATERAL MAMMOGRAM WITH CAD AND TOMO  COMPARISON: None  ACR Breast Density Category b: There are scattered areas of fibroglandular density.  FINDINGS: 2D/3D full field views of both breasts and spot compression view of the LEFT breast demonstrate a moderate amount of normal fibroglandular tissue in the RETROAREOLAR LEFT breast compatible with gynecomastia.  Minimal gynecomastia in the RETROAREOLAR RIGHT breast noted.  No suspicious mass, distortion or worrisome calcifications are noted.  Mammographic images were processed with CAD.  On physical examination, fullness in the LEFT RETROAREOLAR region identified without discrete palpable mass.  IMPRESSION: 1. Moderate LEFT gynecomastia and minimal RIGHT gynecomastia. 2. No mammographic evidence of breast malignancy.  RECOMMENDATION: Surgical consultation as the patient desires possible excision due to pain and fullness.  I have discussed the findings and recommendations with the patient. If applicable, a reminder letter will be sent to the patient regarding the next appointment.  BI-RADS CATEGORY 2: Benign.  Electronically Signed: By:  Harmon Pier M.D. On: 09/30/2019 10:45   Problem List/Past Medical  LEFT BREAST MASS (N63.20) BREAST TENDERNESS IN MALE (N64.4)  Past Surgical History  Colon Polyp Removal - Colonoscopy Shoulder Surgery Right.  Allergies NKDA Medication History Acyclovir (800MG  Tablet, Oral) Active. Ezetimibe (10MG  Tablet, Oral) Active. ALPRAZolam (0.5MG  Tablet, Oral) Active. Lansoprazole (30MG  Capsule DR, Oral) Active. Adderall XR (10MG  Capsule ER 24HR, Oral) Active. HYDROcodone-Acetaminophen (5-325MG  Tablet, Oral) Active. diazePAM (5MG  Tablet, Oral) Active.  Social History  Alcohol use Moderate alcohol use. Caffeine use Coffee. No drug use  Family History  Hypertension Mother.  Other Problems  Arthritis Back Pain Gastroesophageal Reflux Disease High blood pressure Hypercholesterolemia     Review of Systems  General Not Present- Appetite Loss, Chills, Fatigue, Fever, Night Sweats, Weight Gain and Weight Loss. Skin Not Present- Change in Wart/Mole, Dryness, Hives, Jaundice, New Lesions, Non-Healing Wounds, Rash and Ulcer. HEENT Not Present- Earache, Hearing Loss, Hoarseness, Nose Bleed, Oral Ulcers, Ringing in the Ears, Seasonal Allergies, Sinus Pain, Sore Throat, Visual Disturbances, Wears glasses/contact lenses and Yellow Eyes. Respiratory Not Present- Bloody sputum, Chronic Cough, Difficulty Breathing, Snoring and Wheezing. Breast Present- Breast Mass. Not Present- Breast Pain, Nipple Discharge and Skin Changes. Cardiovascular Not Present- Chest Pain, Difficulty Breathing Lying Down, Leg Cramps, Palpitations, Rapid Heart Rate, Shortness of Breath and Swelling of Extremities. Gastrointestinal Not Present- Abdominal Pain, Bloating, Bloody Stool, Change in Bowel Habits, Chronic diarrhea, Constipation, Difficulty Swallowing, Excessive gas, Gets full quickly at meals, Hemorrhoids, Indigestion, Nausea, Rectal Pain and Vomiting. Male Genitourinary Not Present-  Blood in Urine, Change in Urinary Stream, Frequency, Impotence, Nocturia, Painful Urination, Urgency and Urine Leakage. Musculoskeletal Not Present- Back Pain, Joint Pain, Joint Stiffness, Muscle Pain, Muscle Weakness and Swelling of Extremities. Neurological Not Present- Decreased Memory, Fainting, Headaches, Numbness, Seizures, Tingling,  Tremor, Trouble walking and Weakness. Psychiatric Not Present- Anxiety, Bipolar, Change in Sleep Pattern, Depression, Fearful and Frequent crying. Endocrine Not Present- Cold Intolerance, Excessive Hunger, Hair Changes, Heat Intolerance, Hot flashes and New Diabetes. Hematology Not Present- Blood Thinners, Easy Bruising, Excessive bleeding, Gland problems, HIV and Persistent Infections.  Vitals  Weight: 167.38 lb Height: 67in Body Surface Area: 1.87 m Body Mass Index: 26.21 kg/m  Temp.: 98.7F  Pulse: 77 (Regular)  BP: 134/80(Sitting, Left Arm, Standard)        Physical Exam   The physical exam findings are as follows: Note:Constitutional: WDWN in NAD, conversant, no obvious deformities; resting comfortably Eyes: Pupils equal, round; sclera anicteric; moist conjunctiva; no lid lag HENT: Oral mucosa moist; good dentition Neck: No masses palpated, trachea midline; no thyromegaly Lungs: CTA bilaterally; normal respiratory effort Breasts: Minimal asymmetry with the left side slightly greater than the right. However beginning in the left upper lateral retroareolar region extending up into the upper outer quadrant, there is a area of firmness that measures about 3 cm in diameter. This appears to be about a centimeter or 1.5 cm in thickness. This areas moderately tender to palpation. There are no overlying skin changes. No drainage from the nipple. CV: Regular rate and rhythm; no murmurs; extremities well-perfused with no edema Abd: +bowel sounds, soft, non-tender, no palpable organomegaly; no palpable hernias Musc: Normal gait; no  apparent clubbing or cyanosis in extremities Lymphatic: No palpable cervical or axillary lymphadenopathy Skin: Warm, dry; no sign of jaundice Psychiatric - alert and oriented x 4; calm mood and affect    Assessment & Plan  LEFT BREAST MASS (N63.20)   BREAST TENDERNESS IN MALE (N64.4)  Current Plans Schedule for Surgery - Left excisional breast biopsy. The surgical procedure has been discussed with the patient. Potential risks, benefits, alternative treatments, and expected outcomes have been explained. All of the patient's questions at this time have been answered. The likelihood of reaching the patient's treatment goal is good. The patient understand the proposed surgical procedure and wishes to proceed.   Wilmon Arms. Corliss Skains, MD, [facility]  General/ Trauma Surgery   12/03/2019 9:56 AM

## 2019-12-03 NOTE — Anesthesia Postprocedure Evaluation (Signed)
Anesthesia Post Note  Patient: Donald Hubbard  Procedure(s) Performed: LEFT EXCISIONAL BREAST BIOPSY (Left Breast)     Patient location during evaluation: PACU Anesthesia Type: General Level of consciousness: awake and alert Pain management: pain level controlled Vital Signs Assessment: post-procedure vital signs reviewed and stable Respiratory status: spontaneous breathing, nonlabored ventilation and respiratory function stable Cardiovascular status: blood pressure returned to baseline and stable Postop Assessment: no apparent nausea or vomiting Anesthetic complications: no   No complications documented.  Last Vitals:  Vitals:   12/03/19 1215 12/03/19 1237  BP: 133/73 135/86  Pulse: 70 74  Resp: 16 16  Temp:  36.6 C  SpO2: 99% 99%    Last Pain:  Vitals:   12/03/19 1237  TempSrc: Oral  PainSc: 0-No pain                 Lowella Curb

## 2019-12-03 NOTE — Transfer of Care (Signed)
Immediate Anesthesia Transfer of Care Note  Patient: Donald Hubbard  Procedure(s) Performed: LEFT EXCISIONAL BREAST BIOPSY (Left Breast)  Patient Location: PACU  Anesthesia Type:General  Level of Consciousness: drowsy, patient cooperative and responds to stimulation  Airway & Oxygen Therapy: Patient Spontanous Breathing and Patient connected to face mask oxygen  Post-op Assessment: Report given to RN and Post -op Vital signs reviewed and stable  Post vital signs: Reviewed and stable  Last Vitals:  Vitals Value Taken Time  BP    Temp    Pulse 77 12/03/19 1129  Resp 10 12/03/19 1129  SpO2 100 % 12/03/19 1129  Vitals shown include unvalidated device data.  Last Pain:  Vitals:   12/03/19 0948  TempSrc: Oral  PainSc: 0-No pain      Patients Stated Pain Goal: 3 (12/03/19 0948)  Complications: No complications documented.

## 2019-12-03 NOTE — Op Note (Signed)
Preop diagnosis: Tender, enlarging left breast mass Postop diagnosis: Same Procedure performed: Left excisional breast biopsy Surgeon:Montae Stager K Savon Cobbs Anesthesia: General via LMA with pectoralis block Indications:The patient is a 59 year old male who presents with a complaint of Mass. This is a healthy 59 year old male who presents with a 2 year history of slowly enlarging tender left breast mass. The patient states that he has intermittently noticed some tenderness in the left breast in the upper outer quadrant. This has become more noticeable more severe recently. He can palpate a firm tender mass in this area. He underwent mammogram that was unremarkable. No biopsies were performed. Due to the ongoing tenderness and palpable mass, he presents now to discuss excision.  Description of procedure: The patient is brought to the operating room and placed in supine position on the operating room table.  After an adequate level of general anesthesia was obtained, his left chest was prepped with ChloraPrep and draped sterile fashion.  A timeout was taken to ensure the proper patient and proper procedure.  The mass is palpable in the left upper outer quadrant about 2 cm from the nipple.  We made area circumareolar incision around the upper half of the areola.  I dissected inferiorly to just below the nipple.  We dissected down to the chest wall.  We then raised skin flaps superiorly and laterally until we were past the edge of the firm palpable breast tissue.  We then dissected all this breast tissue off of the underlying muscle.  The specimen was oriented with a paint kit and was sent for pathologic examination.  We irrigated thoroughly and inspected for hemostasis.  The wound was closed with 3-0 Vicryl and 4-0 Monocryl.  Benzoin and Steri-Strips were applied.  A dry dressing was applied.  The patient was then extubated and brought to the recovery room in stable condition.  All sponge, instrument, and needle  counts are correct.  Imogene Burn. Georgette Dover, MD, [facility] Surgery  General/ Trauma Surgery   12/03/2019 11:27 AM

## 2019-12-03 NOTE — Discharge Instructions (Signed)
[facility] Office Phone Number [phone]  BREAST BIOPSY/ PARTIAL MASTECTOMY: POST OP INSTRUCTIONS  Always review your discharge instruction sheet given to you by the facility where your surgery was performed.  IF YOU HAVE DISABILITY OR FAMILY LEAVE FORMS, YOU MUST BRING THEM TO THE OFFICE FOR PROCESSING.  DO NOT GIVE THEM TO YOUR DOCTOR.  1. A prescription for pain medication may be given to you upon discharge.  Take your pain medication as prescribed, if needed.  If narcotic pain medicine is not needed, then you may take acetaminophen (Tylenol) or ibuprofen (Advil) as needed. 2. Take your usually prescribed medications unless otherwise directed 3. If you need a refill on your pain medication, please contact your pharmacy.  They will contact our office to request authorization.  Prescriptions will not be filled after 5pm or on week-ends. 4. You should eat very light the first 24 hours after surgery, such as soup, crackers, pudding, etc.  Resume your normal diet the day after surgery. 5. Most patients will experience some swelling and bruising in the breast.  Ice packs and a good support bra will help.  Swelling and bruising can take several days to resolve.  6. It is common to experience some constipation if taking pain medication after surgery.  Increasing fluid intake and taking a stool softener will usually help or prevent this problem from occurring.  A mild laxative (Milk of Magnesia or Miralax) should be taken according to package directions if there are no bowel movements after 48 hours. 7. Unless discharge instructions indicate otherwise, you may remove your bandages 24-48 hours after surgery, and you may shower at that time.  You may have steri-strips (small skin tapes) in place directly over the incision.  These strips should be left on the skin for 7-10 days.  If your surgeon used skin glue on the incision, you may shower in 24 hours.  The glue will flake off over the  next 2-3 weeks.  Any sutures or staples will be removed at the office during your follow-up visit. 8. ACTIVITIES:  You may resume regular daily activities (gradually increasing) beginning the next day.  Wearing a good support bra or sports bra minimizes pain and swelling.  You may have sexual intercourse when it is comfortable. a. You may drive when you no longer are taking prescription pain medication, you can comfortably wear a seatbelt, and you can safely maneuver your car and apply brakes. b. RETURN TO WORK:  ______________________________________________________________________________________ 9. You should see your doctor in the office for a follow-up appointment approximately two weeks after your surgery.  Your doctor's nurse will typically make your follow-up appointment when she calls you with your pathology report.  Expect your pathology report 2-3 business days after your surgery.  You may call to check if you do not hear from [place] after three days. 10. OTHER INSTRUCTIONS: _______________________________________________________________________________________________ _____________________________________________________________________________________________________________________________________ _____________________________________________________________________________________________________________________________________ ___________________________________________________________________________________________________________________________  NO TYLENOL PRODUCTS UNTIL 4:00PM  OXYCODONE 5 MG GIVEN AT 11:45     __________  WHEN TO CALL YOUR DOCTOR: 1. Fever over 101.0 2. Nausea and/or vomiting. 3. Extreme swelling or bruising. 4. Continued bleeding from incision. 5. Increased pain, redness, or drainage from the incision.  The clinic staff is available to answer your questions during regular business hours.  Please don't hesitate to call and ask to speak to one of the nurses  for clinical concerns.  If you have a medical emergency, go to the nearest emergency room or call 911.  A surgeon from  [facility] is always on call at the hospital.  For further questions, please visit [email or web address]       Post Anesthesia Home Care Instructions  Activity: Get plenty of rest for the remainder of the day. A responsible individual must stay with you for 24 hours following the procedure.  For the next 24 hours, DO NOT: -Drive a car -Advertising copywriter -Drink alcoholic beverages -Take any medication unless instructed by your physician -Make any legal decisions or sign important papers.  Meals: Start with liquid foods such as gelatin or soup. Progress to regular foods as tolerated. Avoid greasy, spicy, heavy foods. If nausea and/or vomiting occur, drink only clear liquids until the nausea and/or vomiting subsides. Call your physician if vomiting continues.  Special Instructions/Symptoms: Your throat may feel dry or sore from the anesthesia or the breathing tube placed in your throat during surgery. If this causes discomfort, gargle with warm salt water. The discomfort should disappear within 24 hours.  If you had a scopolamine patch placed behind your ear for the management of post- operative nausea and/or vomiting:  1. The medication in the patch is effective for 72 hours, after which it should be removed.  Wrap patch in a tissue and discard in the trash. Wash hands thoroughly with soap and water. 2. You may remove the patch earlier than 72 hours if you experience unpleasant side effects which may include dry mouth, dizziness or visual disturbances. 3. Avoid touching the patch. Wash your hands with soap and water after contact with the patch.

## 2019-12-03 NOTE — Anesthesia Procedure Notes (Signed)
Procedure Name: LMA Insertion Date/Time: 12/03/2019 10:32 AM Performed by: Thornell Mule, CRNA Pre-anesthesia Checklist: Patient identified, Emergency Drugs available, Suction available and Patient being monitored Patient Re-evaluated:Patient Re-evaluated prior to induction Oxygen Delivery Method: Circle system utilized Preoxygenation: Pre-oxygenation with 100% oxygen Induction Type: IV induction LMA: LMA inserted LMA Size: 4.0 Number of attempts: 1 Placement Confirmation: CO2 detector Tube secured with: Tape Dental Injury: Teeth and Oropharynx as per pre-operative assessment

## 2019-12-04 ENCOUNTER — Encounter (HOSPITAL_BASED_OUTPATIENT_CLINIC_OR_DEPARTMENT_OTHER): Payer: Self-pay | Admitting: Surgery

## 2019-12-07 LAB — SURGICAL PATHOLOGY

## 2021-05-22 ENCOUNTER — Ambulatory Visit: Payer: Commercial Managed Care - PPO | Admitting: Cardiology

## 2021-05-22 ENCOUNTER — Encounter: Payer: Self-pay | Admitting: Cardiology

## 2021-05-22 ENCOUNTER — Other Ambulatory Visit: Payer: Self-pay

## 2021-05-22 DIAGNOSIS — E785 Hyperlipidemia, unspecified: Secondary | ICD-10-CM | POA: Diagnosis not present

## 2021-05-22 DIAGNOSIS — I10 Essential (primary) hypertension: Secondary | ICD-10-CM | POA: Diagnosis not present

## 2021-05-22 MED ORDER — AMLODIPINE BESYLATE 2.5 MG PO TABS: ORAL_TABLET | ORAL | 6 refills | Status: DC

## 2021-05-22 NOTE — Progress Notes (Signed)
Primary Care Provider: Joycelyn Rua, MD [facility] Cardiologist: Bryan Lemma, MD Electrophysiologist: None  Clinic Note: Chief Complaint  Patient presents with   New Patient (Initial Visit)    Referred for high blood pressure control   ===================================  ASSESSMENT/PLAN   Problem List Items Addressed This Visit       Cardiology Problems   Essential hypertension (Chronic)    Difficult to manage blood pressure with intolerance to medications.  I do think that atenolol as the only intended beta-blocker means that there still is some room to try something like carvedilol or Bystolic. Interestingly he was hypotensive with a diuretic or ARB.  Doxazosin makes sense, but not dosed.  Currently his blood pressure looks great today on amlodipine.  My recommendation since he is having some elevated pressure readings is to increase his morning dose of amlodipine to 5 mg and continue take two-point 5 in the evening.  He will continue to monitor his pressures.  He is only checking his pressures before he takes his medications.  I want him to alternate days where he takes it before meds and then other days where he takes it after meds a couple hours later.  This way we can see what effect is actually being had.  Thankfully, he is pretty asymptomatic otherwise.  We will have him follow-up with the clinical pharmacy team in our CV[facility] ([facility]) for BP management.  Check renal artery Dopplers to exclude RAS      Relevant Medications   amLODipine (NORVASC) 2.5 MG tablet   Other Relevant Orders   EKG 12-Lead (Completed)   AMB Referral to [facility] Pharm-D   VAS US RENAL ARTERY DUPLEX   Hyperlipidemia with target LDL less than 100 (Chronic)    Berry significant family history of CAD.  Dramatic response since August 2020 August 2022 1 Zetia alone.  This is possibly because of dietary modification.  He has been intolerant of pravastatin  rosuvastatin and he thinks atorvastatin.  When I see him back in follow-up, we can discuss risk stratification with Coronary CTA.  At this point he was not interested in an out-of-pocket cost. I did provide him information on this test and explained the reasoning would be to determine if we need more aggressive with BP management.  He will continue to stay active and pay attention to symptoms.      Relevant Medications   amLODipine (NORVASC) 2.5 MG tablet    ===================================  HPI:    Donald Hubbard is a 61 y.o. male with PMH notable for hypertension, anxiety, ADD, HLD who is being seen today for the evaluation and treatment of ESSENTIAL HYPERTENSION at the request of Joycelyn Rua, MD.  Donald Hubbard was last seen on May 01, 2021 by Dr. Izola Price.  Blood pressure at that time was 159/100 with repeat of 137/90.  Apparently there is been issues with blood pressure medication in the past.  Had bradycardia with atenolol itching with lisinopril and potentially low blood pressures with ARB such as losartan and HCTZ.  Was started on amlodipine 2.5 mg daily at this visit and referred for cardiology evaluation. -> Was started on amlodipine 2.5 mg daily. Maintained on lovastatin 10 mg 2 days a week plus Zetia (lipids as of August 2022 showed LDL of 113 notably improved from 178 in December 2022.  Recent Hospitalizations: N/A  Reviewed  CV studies:    The following studies were reviewed today: (if available, images/films reviewed: From Epic Chart or  Care Everywhere) NA:  Interval History:   Donald Hubbard presents here today for cardiology evaluation for hypertension that is been difficult to control.  Interestingly, his blood pressure today looks outstanding.  He said that for the most part his blood pressure is usually at home are ranging in the 140 to 150 mmHg range for systolic pressures.  When he gets a little higher than that he can feel tired and then may have a headache.   Sometimes he is a little jittery.  We tells me that on his own, he started taking his amlodipine 2.5 mg twice daily as opposed to just once daily.  He has noted some improvement in pressure since then which may be indicative indicative of the pressures today.  Other than maybe some mild exertional dyspnea with overdoing it, especially going up hills etc. and some rare mild palpitations, he does not really have any active cardiac symptoms.  He does tried to exercise a least 2-3 times a week and is otherwise pretty active.  He has not really noted any chest pain or pressure.  No syncope or near syncope.  No PND, orthopnea or edema.  CV Review of Symptoms (Summary) Cardiovascular ROS: positive for - dyspnea on exertion, irregular heartbeat, palpitations, and a little bit of exertional intolerance.  Palpitations are very rare irregular heartbeats. negative for - chest pain, edema, orthopnea, paroxysmal nocturnal dyspnea, rapid heart rate, shortness of breath, or syncope/near syncope or TIA/MR suspicious calcification  REVIEWED OF SYSTEMS   Review of Systems  Constitutional:  Positive for malaise/fatigue (Feels tired when his blood pressure goes up.). Negative for weight loss.  HENT:  Negative for congestion and nosebleeds.   Eyes:  Negative for blurred vision (Does not have blurred vision with elevated pressures.).  Respiratory:  Negative for shortness of breath.        Uses as needed inhaler for shortness of breath and wheezing, usually in the summer  Cardiovascular:        Per HPI  Gastrointestinal:  Positive for heartburn. Negative for blood in stool.  Genitourinary:  Negative for frequency and hematuria.  Musculoskeletal:  Positive for joint pain (Shoulder pain-left shoulder pain with impingement syndrome). Negative for myalgias.  Neurological:  Positive for dizziness (Sometimes her blood pressure is high). Negative for speech change and focal weakness.  Psychiatric/Behavioral:  Negative  for depression and memory loss. The patient is nervous/anxious. The patient does not have insomnia.    I have reviewed and (if needed) personally updated the patient's problem list, medications, allergies, past medical and surgical history, social and family history.   PAST MEDICAL HISTORY   Past Medical History:  Diagnosis Date   Adult ADHD    Chronic pain    GERD (gastroesophageal reflux disease)    High cholesterol    History of viral pericarditis 2000   virus caused swelling and fluid to build up around the heart   Hypertension    no meds   Sciatic nerve pain     PAST SURGICAL HISTORY   Past Surgical History:  Procedure Laterality Date   BREAST BIOPSY Left 12/03/2019   Procedure: LEFT EXCISIONAL BREAST BIOPSY;  Surgeon: Donnie Mesa, MD;  Location: [facility];  Service: General;  Laterality: Left;  LMA, PEC BLOCK   NASAL SINUS SURGERY     Periodontal surgery     ROTATOR CUFF REPAIR  05/28/2017   Dr. Onnie Graham     There is no immunization history on file for this patient.  MEDICATIONS/ALLERGIES   Current Meds  Medication Sig   albuterol (VENTOLIN HFA) 108 (90 Base) MCG/ACT inhaler Inhale into the lungs every 6 (six) hours as needed for wheezing or shortness of breath.   amphetamine-dextroamphetamine (ADDERALL XR) 10 MG 24 hr capsule Take 10 mg by mouth daily.   DiazePAM (VALIUM PO) Take 5 mg by mouth as needed.   ezetimibe (ZETIA) 10 MG tablet Take 10 mg by mouth daily.   lansoprazole (PREVACID) 15 MG capsule Take 15 mg by mouth daily at 12 noon.   oxyCODONE (OXY IR/ROXICODONE) 5 MG immediate release tablet Take 1 tablet (5 mg total) by mouth every 6 (six) hours as needed for severe pain.   [DISCONTINUED] amLODipine (NORVASC) 2.5 MG tablet Take 2.5 mg by mouth daily.    Allergies  Allergen Reactions   Atenolol     Other reaction(s): bradycardia   Crestor [Rosuvastatin]     Other reaction(s): myalgias   Doxazosin Mesylate     Other reaction(s):  hypotension   Lisinopril     Other reaction(s): itching   Pravastatin     Other reaction(s): Unknown   Had issues with blood pressure being too low using losartan, doxazosin and HCTZ.  SOCIAL HISTORY/FAMILY HISTORY   Reviewed in Epic:   Social History   Tobacco Use   Smoking status: Never   Smokeless tobacco: Never  Substance Use Topics   Alcohol use: Yes    Comment: daily, 2 drinks   Drug use: No   Social History   Social History Narrative   2 servings of coffee per day.   Family History  Problem Relation Age of Onset   Coronary artery disease Mother 49       Was a long-term smoker   Coronary artery disease Father 99       At CABG at age 63    OBJCTIVE -PE, EKG, labs   Wt Readings from Last 3 Encounters:  05/22/21 178 lb (80.7 kg)  12/03/19 170 lb 13.7 oz (77.5 kg)  06/10/17 162 lb (73.5 kg)    Physical Exam: BP (!) 110/92 (BP Location: Right Arm, Patient Position: Sitting, Cuff Size: Normal)    Pulse 93    Ht 5' 6.5" (1.689 m)    Wt 178 lb (80.7 kg)    BMI 28.30 kg/m  Physical Exam Vitals reviewed.  Constitutional:      General: He is not in acute distress.    Appearance: Normal appearance. He is normal weight. He is not ill-appearing or toxic-appearing.     Comments: Well-nourished, well-groomed.  HENT:     Head: Normocephalic and atraumatic.  Eyes:     Extraocular Movements: Extraocular movements intact.     Pupils: Pupils are equal, round, and reactive to light.  Neck:     Vascular: No carotid bruit or JVD.  Cardiovascular:     Rate and Rhythm: Normal rate and regular rhythm. No extrasystoles are present.    Chest Wall: PMI is not displaced.     Pulses: Normal pulses.     Heart sounds: S1 normal and S2 normal. No murmur heard.   No friction rub. No gallop.  Pulmonary:     Effort: No respiratory distress.     Breath sounds: Normal breath sounds. No wheezing, rhonchi or rales.  Chest:     Chest wall: No tenderness.  Musculoskeletal:         General: No swelling. Normal range of motion.     Cervical back: Normal range of  motion and neck supple.  Skin:    General: Skin is warm and dry.  Neurological:     General: No focal deficit present.     Mental Status: He is alert and oriented to person, place, and time.     Cranial Nerves: No cranial nerve deficit.     Gait: Gait normal.  Psychiatric:        Mood and Affect: Mood normal.        Behavior: Behavior normal.        Thought Content: Thought content normal.        Judgment: Judgment normal.     Adult ECG Report  Rate: 93 ;  Rhythm: normal sinus rhythm and left atrial enlargement.  Otherwise normal axis, and directions ;   Narrative Interpretation: Normal EKG.  Recent Labs: 12/09/2020 AST 32, ALT 49, AlkP 53 ; TC 195, TG 88, HDL 66, LDL 113  (03/30/2020-TC 262, TG 116, HDL 65, LDL 178; 12/09/2018 TC 334, TG 74, HDL 85, LDL 234)   ==================================================  COVID-19 Education: The signs and symptoms of COVID-19 were discussed with the patient and how to seek care for testing (follow up with PCP or arrange E-visit).    I spent a total of 26 minutes with the patient spent in direct patient consultation.  Additional time spent with chart review  / charting (studies, outside notes, etc): 22 min Total Time: 48 min  Current medicines are reviewed at length with the patient today.  (+/- concerns) none  This visit occurred during the SARS-CoV-2 public health emergency.  Safety protocols were in place, including screening questions prior to the visit, additional usage of staff PPE, and extensive cleaning of exam room while observing appropriate contact time as indicated for disinfecting solutions.  Notice: This dictation was prepared with Dragon dictation along with smart phrase technology. Any transcriptional errors that result from this process are unintentional and may not be corrected upon review.   Studies Ordered:  Orders Placed This Encounter   Procedures   AMB Referral to Heartcare Pharm-D   EKG 12-Lead   VAS US RENAL ARTERY DUPLEX    Patient Instructions / Medication Changes & Studies & Tests Ordered   Patient Instructions  Medication Instructions:     Change the directions --Amlodipine  5 mg ( 2 tablets of 2.5 mg )  and 2.5 mg in the evening  *If you need a refill on your cardiac medications before your next appointment, please call your pharmacy*   Lab Work:  Not needed   Testing/Procedures: Will be schedule at [facility] has requested that you have a renal artery duplex. During this test, an ultrasound is used to evaluate blood flow to the kidneys. Allow one hour for this exam. Do not eat after midnight the day before and avoid carbonated beverages. Take your medications as you usually do.     Follow-Up: At [facility], you and your health needs are our priority.  As part of our continuing mission to provide you with exceptional heart care, we have created designated Provider Care Teams.  These Care Teams include your primary Cardiologist (physician) and Advanced Practice Providers (APPs -  Physician Assistants and Nurse Practitioners) who all work together to provide you with the care you need, when you need it.     Your next appointment:   4 to 6 month(s)  The format for your next appointment:   In Person  Provider:   Shanon Brow  Ellyn Hack, MD   You have been referred to CVRR  [facility] -  for Hypertension   4 to 6 weeks   Other Instructions    Continue to monitor blood pressure  -  keep a recording of blood pressure and bring it with you to your appointment.   Every other day  - take blood pressure reading about 2 hours  after  medication Amlodipine a nd bring the reading to the visit. Glenetta Hew, M.D., M.S. Interventional Cardiologist   Pager # [phone] Phone # [phone] [address] [place], [place] [address]   Thank you for choosing  Heartcare at [facility]!!

## 2021-05-22 NOTE — Patient Instructions (Addendum)
Medication Instructions:     Change the directions --Amlodipine  5 mg ( 2 tablets of 2.5 mg )  and 2.5 mg in the evening  *If you need a refill on your cardiac medications before your next appointment, please call your pharmacy*   Lab Work:  Not needed   Testing/Procedures: Will be schedule at [address]  Your physician has requested that you have a renal artery duplex. During this test, an ultrasound is used to evaluate blood flow to the kidneys. Allow one hour for this exam. Do not eat after midnight the day before and avoid carbonated beverages. Take your medications as you usually do.     Follow-Up: At [facility], you and your health needs are our priority.  As part of our continuing mission to provide you with exceptional heart care, we have created designated Provider Care Teams.  These Care Teams include your primary Cardiologist (physician) and Advanced Practice Providers (APPs -  Physician Assistants and Nurse Practitioners) who all work together to provide you with the care you need, when you need it.     Your next appointment:   4 to 6 month(s)  The format for your next appointment:   In Person  Provider:   Bryan Lemma, MD   You have been referred to CV[facility]  [facility] -  for Hypertension   4 to 6 weeks   Other Instructions    Continue to monitor blood pressure  -  keep a recording of blood pressure and bring it with you to your appointment.   Every other day  - take blood pressure reading about 2 hours  after  medication Amlodipine a nd bring the reading to the visit. Marland Kitchen

## 2021-05-25 ENCOUNTER — Encounter: Payer: Self-pay | Admitting: Cardiology

## 2021-05-25 NOTE — Assessment & Plan Note (Addendum)
Difficult to manage blood pressure with intolerance to medications.  I do think that atenolol as the only intended beta-blocker means that there still is some room to try something like carvedilol or Bystolic. Interestingly he was hypotensive with a diuretic or ARB.  Doxazosin makes sense, but not dosed.  Currently his blood pressure looks great today on amlodipine.  My recommendation since he is having some elevated pressure readings is to increase his morning dose of amlodipine to 5 mg and continue take two-point 5 in the evening.  He will continue to monitor his pressures.  He is only checking his pressures before he takes his medications.  I want him to alternate days where he takes it before meds and then other days where he takes it after meds a couple hours later.  This way we can see what effect is actually being had.  Thankfully, he is pretty asymptomatic otherwise.  We will have him follow-up with the clinical pharmacy team in our CVRR (Cardi[facility] Reduction Clinic) for BP management.  Check renal artery Dopplers to exclude RAS

## 2021-05-25 NOTE — Assessment & Plan Note (Addendum)
Berry significant family history of CAD.  Dramatic response since August 2020 August 2022 1 Zetia alone.  This is possibly because of dietary modification.  He has been intolerant of pravastatin rosuvastatin and he thinks atorvastatin.  When I see him back in follow-up, we can discuss risk stratification with Coronary CTA.  At this point he was not interested in an out-of-pocket cost. I did provide him information on this test and explained the reasoning would be to determine if we need more aggressive with BP management.  He will continue to stay active and pay attention to symptoms.

## 2021-06-21 ENCOUNTER — Encounter (HOSPITAL_COMMUNITY): Payer: Commercial Managed Care - PPO

## 2021-06-22 ENCOUNTER — Ambulatory Visit (HOSPITAL_COMMUNITY)
Admission: RE | Admit: 2021-06-22 | Discharge: 2021-06-22 | Disposition: A | Discharge status: Home / Self Care | Payer: Commercial Managed Care - PPO | Source: Ambulatory Visit | Attending: Cardiology | Admitting: Cardiology

## 2021-06-22 ENCOUNTER — Other Ambulatory Visit: Payer: Self-pay

## 2021-06-22 DIAGNOSIS — I10 Essential (primary) hypertension: Secondary | ICD-10-CM | POA: Insufficient documentation

## 2021-06-23 ENCOUNTER — Encounter: Payer: Self-pay | Admitting: Cardiology

## 2021-07-03 ENCOUNTER — Ambulatory Visit: Payer: Commercial Managed Care - PPO

## 2021-07-20 ENCOUNTER — Ambulatory Visit: Payer: Commercial Managed Care - PPO | Admitting: Pharmacist Clinician (PhC)/ Clinical Pharmacy Specialist

## 2021-07-20 VITALS — BP 132/94 | HR 91 | Resp 14 | Ht 67.0 in | Wt 174.0 lb

## 2021-07-20 DIAGNOSIS — I10 Essential (primary) hypertension: Secondary | ICD-10-CM

## 2021-07-20 DIAGNOSIS — Z1321 Encounter for screening for nutritional disorder: Secondary | ICD-10-CM | POA: Diagnosis not present

## 2021-07-20 DIAGNOSIS — E785 Hyperlipidemia, unspecified: Secondary | ICD-10-CM

## 2021-07-20 MED ORDER — HYDROCHLOROTHIAZIDE 12.5 MG PO CAPS: 12.5000 mg | ORAL_CAPSULE | Freq: Every day | ORAL | 6 refills | Status: DC

## 2021-07-20 NOTE — Assessment & Plan Note (Addendum)
Patient with essential hypertension, notable for diastolic elevation.  Doing well on systolic with amlodipine, but diastolic still > 90.  Will have him cut back on amlodipine to 5 mg daily at night and have him add hydrochlorothiazide 12.5 mg daily in the mornings.  He has had problems with hypotension in the past, so hopefully this will nudge the diastolic down with less systolic impact.   He was also advised to cut back on [employer] coffee consumption.   Will have him go to lab in 2 weeks and get BMET, TSH, Vit D, lipid and liver function.  He is scheduled to follow up with Dr. Herbie Baltimore in May and I can see him after that for any further adjustments.  He was asked to monitor at home 3-4 days per week and bring that information to his appointment.   ?

## 2021-07-20 NOTE — Progress Notes (Signed)
? ? ? ?07/21/2021 ?[name] ?Sep 09, 1960 ?[id number] ? ? ?HPI:  Donald Hubbard is a 61 y.o. male patient of Dr Herbie Baltimore, who presents today for hypertension clinic evaluation.  In addition to hypertension, his medical history is significant for hyperlipidemia (lovastatin and ezetimibe) ADD (on methylphenidate) and chronic sciatic pain.    When he saw Dr. Herbie Baltimore in January, his BP was noted to be 110/92.  He has had challenges with mostly diastolic hypertension, and often has been given medication that causes him to have hypotensive spells when the systolic gets pulled down too low.    ? ?He is here today with his wife.  They note that about 10 years ago he was diagnosed with hypertension, but at that time was able to lose weight and the blood pressure normalized.   His wife thinks he was on HCTZ at that time and it was discontinued because of improved lifestyle/weight loss. ? ?Blood Pressure Goal:  130/80 ? ?Current Medications:  amlodipine 5 mg qam, 2.5 mg qhs ? ?Family DJ:TTSVXB with hypertension now 40, doing well; father had CABG 20 years ago, now also 76, 1 full sister with auto-immune issues; 3 half siblings (mom), 1 with hypertesnion ? ?Social Hx: no tobacco, 2 drinks 3-4 times per week; [employer] coffee 2-3 cups daily (mostly in the mornings, tries to stop by lunchtime) ? ?Diet: mostly home cooked, on weekdays usually only eats dinner- good mix of meat and fresh veggies (chicken and veggies), will eat breakfast on weekends ? ?Exercise: not reglar - started counting steps - taking stairs more  ? ?Home BP readings: none with him today ? ?Intolerances: diruetics and ARB lead to hypotension; lisinopril - rash;  ? Doxazosin - orthostatic hypotension ? ?Labs: no BMET in past year ? ? ?Wt Readings from Last 3 Encounters:  ?07/20/21 174 lb (78.9 kg)  ?05/22/21 178 lb (80.7 kg)  ?12/03/19 170 lb 13.7 oz (77.5 kg)  ? ?BP Readings from Last 3 Encounters:  ?07/20/21 (!) 132/94  ?05/22/21 (!) 110/92  ?12/03/19 135/86   ? ?Pulse Readings from Last 3 Encounters:  ?07/20/21 91  ?05/22/21 93  ?12/03/19 74  ? ? ?Current Outpatient Medications  ?Medication Sig Dispense Refill  ? acyclovir (ZOVIRAX) 800 MG tablet Take 1 tablet by mouth 2 (two) times daily as needed.    ? albuterol (VENTOLIN HFA) 108 (90 Base) MCG/ACT inhaler Inhale into the lungs every 6 (six) hours as needed for wheezing or shortness of breath.    ? amLODipine (NORVASC) 2.5 MG tablet Take 2 tablet in the morning and 2.5 mg in the evening (Patient taking differently: 2.5 mg. Take 2 tablet in the morning and 1 tablet in the evening) 90 tablet 6  ? amphetamine-dextroamphetamine (ADDERALL XR) 15 MG 24 hr capsule Take 15 mg by mouth every morning.    ? cetirizine (ZYRTEC) 10 MG tablet Take 1 tablet by mouth as needed.    ? Cholecalciferol (VITAMIN D-3) 125 MCG (5000 UT) TABS Take 1 tablet by mouth daily.    ? DiazePAM (VALIUM PO) Take 5 mg by mouth as needed.    ? ezetimibe (ZETIA) 10 MG tablet Take 10 mg by mouth daily.    ? fluticasone (FLONASE) 50 MCG/ACT nasal spray Place 1 spray into both nostrils as needed.    ? hydrochlorothiazide (MICROZIDE) 12.5 MG capsule Take 1 capsule (12.5 mg total) by mouth daily. 30 capsule 6  ? HYDROcodone-acetaminophen (NORCO/VICODIN) 5-325 MG tablet Take 1 tablet by mouth every 6 (six) hours  as needed for moderate pain.    ? ipratropium (ATROVENT) 0.06 % nasal spray Place 1 spray into both nostrils daily.    ? lansoprazole (PREVACID) 15 MG capsule Take 15 mg by mouth daily at 12 noon.    ? lovastatin (MEVACOR) 10 MG tablet Take 10 mg by mouth every other day.    ? ?No current facility-administered medications for this visit.  ? ? ?Allergies  ?Allergen Reactions  ? Atenolol   ?  Other reaction(s): bradycardia ?Other reaction(s): bradycardia  ? Doxazosin Mesylate   ?  Other reaction(s): hypotension ?Other reaction(s): hypotension  ? Lisinopril   ?  Other reaction(s): itching ?Other reaction(s): itching  ? Pravastatin   ?  Other reaction(s):  Unknown ?Other reaction(s): muscle aches  ? Rosuvastatin   ?  Other reaction(s): myalgias ?Other reaction(s): myalgias  ? ? ?Past Medical History:  ?Diagnosis Date  ? Adult ADHD   ? Chronic pain   ? GERD (gastroesophageal reflux disease)   ? High cholesterol   ? History of viral pericarditis 2000  ? virus caused swelling and fluid to build up around the heart  ? Hypertension   ? no meds  ? Sciatic nerve pain   ? ? ?Blood pressure (!) 132/94, pulse 91, resp. rate 14, height 5\' 7"  (1.702 m), weight 174 lb (78.9 kg), SpO2 100 %. ? ?Essential hypertension ?Patient with essential hypertension, notable for diastolic elevation.  Doing well on systolic with amlodipine, but diastolic still > 90.  Will have him cut back on amlodipine to 5 mg daily at night and have him add hydrochlorothiazide 12.5 mg daily in the mornings.  He has had problems with hypotension in the past, so hopefully this will nudge the diastolic down with less systolic impact.   He was also advised to cut back on [employer] coffee consumption.   Will have him go to lab in 2 weeks and get BMET, TSH, Vit D, lipid and liver function.  He is scheduled to follow up with Dr. in May and I can see him after that for any further adjustments.  He was asked to monitor at home 3-4 days per week and bring that information to his appointment.   ? ? ?June PharmD CPP [facility] ?[address] ?[place], [place] ?[phone] ?

## 2021-07-20 NOTE — Patient Instructions (Signed)
Return for a a follow up appointment with Dr. Herbie Baltimore on May 22 ? ?Go to the lab in 2 weeks to check kidney function, thyroid function and vitamin D level ? ?Check your blood pressure at home daily and keep record of the readings. ? ?Take your BP meds as follows: ? AM - hydrochlorothiazide 12.5 mg  ? PM -  amlodipine 5 mg ? ?Bring all of your meds, your BP cuff and your record of home blood pressures to your next appointment.  Exercise as you?re able, try to walk approximately 30 minutes per day.  Keep salt intake to a minimum, especially watch canned and prepared boxed foods.  Eat more fresh fruits and vegetables and fewer canned items.  Avoid eating in fast food restaurants.  ? ? HOW TO TAKE YOUR BLOOD PRESSURE: ?Rest 5 minutes before taking your blood pressure. ? Don?t smoke or drink caffeinated beverages for at least 30 minutes before. ?Take your blood pressure before (not after) you eat. ?Sit comfortably with your back supported and both feet on the floor (don?t cross your legs). ?Elevate your arm to heart level on a table or a desk. ?Use the proper sized cuff. It should fit smoothly and snugly around your bare upper arm. There should be enough room to slip a fingertip under the cuff. The bottom edge of the cuff should be 1 inch above the crease of the elbow. ?Ideally, take 3 measurements at one sitting and record the average. ? ? ?

## 2021-07-21 ENCOUNTER — Encounter: Payer: Self-pay | Admitting: Pharmacist Clinician (PhC)/ Clinical Pharmacy Specialist

## 2021-09-06 LAB — BASIC METABOLIC PANEL
BUN/Creatinine Ratio: 11 (ref 10–24)
BUN: 15 mg/dL (ref 8–27)
CO2: 26 mmol/L (ref 20–29)
Calcium: 9.3 mg/dL (ref 8.6–10.2)
Chloride: 102 mmol/L (ref 96–106)
Creatinine, Ser: 1.35 mg/dL — ABNORMAL HIGH (ref 0.76–1.27)
Glucose: 107 mg/dL — ABNORMAL HIGH (ref 70–99)
Potassium: 4.5 mmol/L (ref 3.5–5.2)
Sodium: 144 mmol/L (ref 134–144)
eGFR: 59 mL/min/{1.73_m2} — ABNORMAL LOW (ref >59)

## 2021-09-06 LAB — VITAMIN D 25 HYDROXY (VIT D DEFICIENCY, FRACTURES): Vit D, 25-Hydroxy: 75.4 ng/mL (ref 30.0–100.0)

## 2021-09-06 LAB — LIPID PANEL
Chol/HDL Ratio: 2.8 ratio (ref 0.0–5.0)
Cholesterol, Total: 170 mg/dL (ref 100–199)
HDL: 60 mg/dL (ref >39)
LDL Chol Calc (NIH): 89 mg/dL (ref 0–99)
Triglycerides: 118 mg/dL (ref 0–149)
VLDL Cholesterol Cal: 21 mg/dL (ref 5–40)

## 2021-09-06 LAB — HEPATIC FUNCTION PANEL
ALT: 33 IU/L (ref 0–44)
AST: 23 IU/L (ref 0–40)
Albumin: 4.4 g/dL (ref 3.8–4.8)
Alkaline Phosphatase: 65 IU/L (ref 44–121)
Bilirubin Total: 0.8 mg/dL (ref 0.0–1.2)
Bilirubin, Direct: 0.19 mg/dL (ref 0.00–0.40)
Total Protein: 6.3 g/dL (ref 6.0–8.5)

## 2021-09-06 LAB — TSH: TSH: 0.852 u[IU]/mL (ref 0.450–4.500)

## 2021-09-11 ENCOUNTER — Ambulatory Visit: Payer: Commercial Managed Care - PPO | Admitting: Cardiology

## 2021-09-11 ENCOUNTER — Encounter: Payer: Self-pay | Admitting: Cardiology

## 2021-09-11 VITALS — BP 112/80 | HR 89 | Ht 66.5 in | Wt 174.4 lb

## 2021-09-11 DIAGNOSIS — E785 Hyperlipidemia, unspecified: Secondary | ICD-10-CM

## 2021-09-11 DIAGNOSIS — I10 Essential (primary) hypertension: Secondary | ICD-10-CM | POA: Diagnosis not present

## 2021-09-11 DIAGNOSIS — Z8249 Family history of ischemic heart disease and other diseases of the circulatory system: Secondary | ICD-10-CM | POA: Diagnosis not present

## 2021-09-11 MED ORDER — AMLODIPINE BESYLATE 2.5 MG PO TABS
2.5000 mg | ORAL_TABLET | ORAL | 3 refills | Status: DC | PRN
Start: 2021-09-11 — End: 2021-12-11

## 2021-09-11 MED ORDER — HYDROCHLOROTHIAZIDE 12.5 MG PO CAPS: 12.5000 mg | ORAL_CAPSULE | ORAL | 6 refills | Status: DC | PRN

## 2021-09-11 NOTE — Progress Notes (Signed)
Primary Care Provider: Joycelyn Rua, MD Cardiologist: Bryan Lemma, MD Electrophysiologist: None  Clinic Note: Chief Complaint  Patient presents with   Follow-up    Medications have been adjusted.  Renal artery Dopplers are reviewed.   Hypertension    No longer on amlodipine since starting low-dose HCTZ..  Blood pressures were running low   ===================================  ASSESSMENT/PLAN   Problem List Items Addressed This Visit       Cardiology Problems   Essential hypertension - Primary (Chronic)    Blood pressure readings were all over the place but there were some low recordings.  As such we have discontinued amlodipine.  He is worried about what should happen if he were to go back to having high blood pressures.  Plan: Continue low-dose HCTZ as the only standing medication. He still has amlodipine and we will continue with the 2.5 mg load continues for blood pressure greater than 140 mmHg.  Could also take additional 12.5 mg HCTZ if pressures are elevated in the morning prior to dosing..      Relevant Medications   hydrochlorothiazide (MICROZIDE) 12.5 MG capsule   amLODipine (NORVASC) 2.5 MG tablet   Other Relevant Orders   CT CARDIAC SCORING (SELF PAY ONLY)   Hyperlipidemia with target LDL less than 100 (Chronic)    He has a significant family history of CAD.  He had a pretty amazing response on his lipid labs on a combination of lovastatin and Zetia.  Currently taking lovastatin every other day.  With Brabham history, we talked about risk stratification with coronary CTA to determine goals of care.  We talked about with his family agree that he probably should go forward with the study.  We will therefore schedule Coronary Calcium Score       Relevant Medications   hydrochlorothiazide (MICROZIDE) 12.5 MG capsule   amLODipine (NORVASC) 2.5 MG tablet   Other Relevant Orders   CT CARDIAC SCORING (SELF PAY ONLY)     Other   Family history of early CAD     He is agreeable to proceed with risk stratification using Coronary Calcium Score.  This will help [place] determine target LDL levels        ===================================  HPI:    Donald Hubbard is a 61 y.o. male with a PMH notable for HTN, HLD, anxiety and ADD who presents today for 82-month follow-up at the request of Dr. Silvestre Moment.[name] was last seen on May 22, 2021 for evaluation and management of  difficult to manage hypertension.  Noted his blood Pressure readings at home with systolic numbers in the 140-150 range --> noted that he feels tired and jittery when the IDy with improvement in pressure. Atenolol has been d/c 2/2 bradycardia.  (BB d/c 2/ bradycardia, ACE-I d/c b/c itching & ARB d/c b/c hypotension) Increased morning dose of amlodipine to 5 mg.  Recent Hospitalizations: none  Seen in [facility] hypertension clinic by [facility], [facility] 07/20/2021 => pressure still noted to be elevated.,  HCTZ 12.5 milligram added in the morning and 0 to 2.5 mg amlodipine, nighttime dose amlodipine at 5 mg.  Recommended cutting back caffeine.   Reviewed  CV studies:    The following studies were reviewed today: (if available, images/films reviewed: From Epic Chart or Care Everywhere) 06/22/2021: Renal artery Dopplers-normal   Interval History:   Donald Hubbard returns today for follow-up showing the list of his blood pressure recordings ranging from 98/79 average to a as low as 80/46  mmHg.  Occasional readings as high as 140/92.  (There was 1 reading of 65/38 on May 11. Because of low blood pressures, his PCP had him stop amlodipine.  Noted his blood pressure is dropping as low as the 90s as mentioned.  He also noted an episode back in March where his heart rates are running up high.  Averaging in the 100s.  His Apple Watch suggested irregular heartbeats on a couple occasions earlier this month.  He denies any lightheadedness or dizziness associated with the tachycardia,  but does low blood pressures.  No syncope or near syncope.  No falls.  Continues to note the irregular heartbeats and some fast heart rate spells.  Also some exertional dyspnea-indicating deconditioning.  CV Review of Symptoms (Summary) Cardiovascular ROS: positive for - dyspnea on exertion, irregular heartbeat, palpitations, rapid heart rate, and some lightheadedness dizziness and borderline near syncope negative for - chest pain, edema, loss of consciousness, orthopnea, paroxysmal nocturnal dyspnea, shortness of breath, or TIA or amaurosis fugax, claudication  REVIEWED OF SYSTEMS   Review of Systems  Constitutional:  Positive for malaise/fatigue (Either ranges of blood pressure.). Negative for weight loss.  HENT:  Negative for congestion.   Respiratory:  Negative for cough and shortness of breath.   Cardiovascular:        Per HPI  Gastrointestinal:  Positive for heartburn. Negative for blood in stool, constipation and melena.  Genitourinary:  Negative for dysuria and hematuria.  Musculoskeletal:  Negative for joint pain and myalgias.  Neurological:  Positive for dizziness (With low blood pressures).  Psychiatric/Behavioral:  The patient is nervous/anxious.    I have reviewed and (if needed) personally updated the patient's problem list, medications, allergies, past medical and surgical history, social and family history.   PAST MEDICAL HISTORY   Past Medical History:  Diagnosis Date   Adult ADHD    Chronic pain    GERD (gastroesophageal reflux disease)    High cholesterol    History of viral pericarditis 2000   virus caused swelling and fluid to build up around the heart   Hypertension    no meds   Sciatic nerve pain     PAST SURGICAL HISTORY   Past Surgical History:  Procedure Laterality Date   BREAST BIOPSY Left 12/03/2019   Procedure: LEFT EXCISIONAL BREAST BIOPSY;  Surgeon: Manus Rudd, MD;  Location: [facility];  Service: General;  Laterality:  Left;  LMA, PEC BLOCK   NASAL SINUS SURGERY     Periodontal surgery     ROTATOR CUFF REPAIR  05/28/2017   Dr. Rennis Chris     There is no immunization history on file for this patient.  MEDICATIONS/ALLERGIES   Current Meds  Medication Sig   acyclovir (ZOVIRAX) 800 MG tablet Take 1 tablet by mouth 2 (two) times daily as needed.   albuterol (VENTOLIN HFA) 108 (90 Base) MCG/ACT inhaler Inhale into the lungs every 6 (six) hours as needed for wheezing or shortness of breath.   amphetamine-dextroamphetamine (ADDERALL XR) 15 MG 24 hr capsule Take 15 mg by mouth every morning.   cetirizine (ZYRTEC) 10 MG tablet Take 1 tablet by mouth as needed.   Cholecalciferol (VITAMIN D-3) 125 MCG (5000 UT) TABS Take 1 tablet by mouth daily.   DiazePAM (VALIUM PO) Take 5 mg by mouth as needed.   ezetimibe (ZETIA) 10 MG tablet Take 10 mg by mouth daily.   fluticasone (FLONASE) 50 MCG/ACT nasal spray Place 1 spray into both nostrils as needed.  HYDROcodone-acetaminophen (NORCO/VICODIN) 5-325 MG tablet Take 1 tablet by mouth every 6 (six) hours as needed for moderate pain.   ipratropium (ATROVENT) 0.06 % nasal spray Place 1 spray into both nostrils daily.   lansoprazole (PREVACID) 15 MG capsule Take 15 mg by mouth daily at 12 noon.   lovastatin (MEVACOR) 10 MG tablet Take 10 mg by mouth every other day.    hydrochlorothiazide (MICROZIDE) 12.5 MG capsule Take 1 capsule (12.5 mg total) by mouth daily.    Allergies  Allergen Reactions   Atenolol     Other reaction(s): bradycardia Other reaction(s): bradycardia   Doxazosin Mesylate     Other reaction(s): hypotension Other reaction(s): hypotension   Lisinopril     Other reaction(s): itching Other reaction(s): itching   Pravastatin     Other reaction(s): Unknown Other reaction(s): muscle aches   Rosuvastatin     Other reaction(s): myalgias Other reaction(s): myalgias    SOCIAL HISTORY/FAMILY HISTORY   Reviewed in Epic:  Pertinent findings:  Social  History   Tobacco Use   Smoking status: Never   Smokeless tobacco: Never  Substance Use Topics   Alcohol use: Yes    Comment: daily, 2 drinks   Drug use: No   Social History   Social History Narrative   Social Hx: no tobacco, 2 drinks 3-4 times per week; [employer]bucks coffee 2-3 cups daily (mostly in the mornings, tries to stop by lunchtime)       Diet: mostly home cooked, on weekdays usually only eats dinner- good mix of meat and fresh veggies (chicken and veggies), will eat breakfast on weekends       Exercise: not reglar - started counting steps - taking stairs more         OBJCTIVE -PE, EKG, labs   Wt Readings from Last 3 Encounters:  09/11/21 174 lb 6.4 oz (79.1 kg)  07/20/21 174 lb (78.9 kg)  05/22/21 178 lb (80.7 kg)    Physical Exam: BP 112/80   Pulse 89   Ht 5' 6.5" (1.689 m)   Wt 174 lb 6.4 oz (79.1 kg)   SpO2 98%   BMI 27.73 kg/m  Physical Exam Vitals reviewed.  Constitutional:      General: He is not in acute distress.    Appearance: Normal appearance. He is normal weight. He is not ill-appearing or toxic-appearing.  HENT:     Head: Normocephalic and atraumatic.  Neck:     Vascular: No carotid bruit or JVD.  Cardiovascular:     Rate and Rhythm: Normal rate and regular rhythm. Occasional Extrasystoles are present.    Chest Wall: PMI is not displaced.     Pulses: Normal pulses.     Heart sounds: Normal heart sounds, S1 normal and S2 normal. No murmur heard.   No friction rub. No gallop.  Pulmonary:     Effort: Pulmonary effort is normal. No respiratory distress.     Breath sounds: Normal breath sounds. No wheezing, rhonchi or rales.  Chest:     Chest wall: No tenderness.  Musculoskeletal:        General: No swelling.     Cervical back: Normal range of motion and neck supple.  Skin:    General: Skin is warm and dry.  Neurological:     General: No focal deficit present.     Mental Status: He is alert and oriented to person, place, and time.      Gait: Gait normal.  Psychiatric:  Mood and Affect: Mood normal.        Behavior: Behavior normal.        Thought Content: Thought content normal.        Judgment: Judgment normal.    Adult ECG Report Not checked  Recent Labs: Reviewed Lab Results  Component Value Date   CHOL 170 09/05/2021   HDL 60 09/05/2021   LDLCALC 89 09/05/2021   TRIG 118 09/05/2021   CHOLHDL 2.8 09/05/2021   Lab Results  Component Value Date   CREATININE 1.35 (H) 09/05/2021   BUN 15 09/05/2021   NA 144 09/05/2021   K 4.5 09/05/2021   CL 102 09/05/2021   CO2 26 09/05/2021      Latest Ref Rng & Units 06/10/2017    3:33 PM  CBC  WBC 4.0 - 10.5 K/uL 5.9    Hemoglobin 13.0 - 17.0 g/dL 16.1    Hematocrit 09.6 - 52.0 % 43.7    Platelets 150 - 400 K/uL 158      No results found for: HGBA1C Lab Results  Component Value Date   TSH 0.852 09/05/2021    ==================================================  COVID-19 Education: The signs and symptoms of COVID-19 were discussed with the patient and how to seek care for testing (follow up with PCP or arrange E-visit).    I spent a total of 33 minutes with the patient spent in direct patient consultation.  Additional time spent with chart review  / charting (studies, outside notes, etc): 12 min Total Time: 45 min  Current medicines are reviewed at length with the patient today.  (+/- concerns) none  This visit occurred during the SARS-CoV-2 public health emergency.  Safety protocols were in place, including screening questions prior to the visit, additional usage of staff PPE, and extensive cleaning of exam room while observing appropriate contact time as indicated for disinfecting solutions.  Notice: This dictation was prepared with Dragon dictation along with smart phrase technology. Any transcriptional errors that result from this process are unintentional and may not be corrected upon review.  Studies Ordered:   Orders Placed This Encounter   Procedures   CT CARDIAC SCORING (SELF PAY ONLY)   Meds ordered this encounter  Medications   hydrochlorothiazide (MICROZIDE) 12.5 MG capsule    Sig: Take 1 capsule (12.5 mg total) by mouth as needed. For systolic blood pressure greater 140    Dispense:  30 capsule    Refill:  6   amLODipine (NORVASC) 2.5 MG tablet    Sig: Take 1 tablet (2.5 mg total) by mouth as needed. For systolic blood pressure greater than 140    Dispense:  180 tablet    Refill:  3    Patient Instructions / Medication Changes & Studies & Tests Ordered   Patient Instructions  Medication Instructions:    No other changes except HCTZ  or  Amlodipine  = only use one or the other if systolic Blood pressure is greater than 140 /?   *If you need a refill on your cardiac medications before your next appointment, please call your pharmacy*   Lab Work: Not needed    Testing/Procedures: CT coronary calcium score.   Test locations:  [facility] ([address] [place], [place] [address]) [facility] ([address] [place], [place] [address])   This is $99 out of pocket.   Coronary CalciumScan A coronary calcium scan is an imaging test used to look for deposits of calcium and other fatty materials (plaques) in the  inner lining of the blood vessels of the heart (coronary arteries). These deposits of calcium and plaques can partly clog and narrow the coronary arteries without producing any symptoms or warning signs. This puts a person at risk for a heart attack. This test can detect these deposits before symptoms develop. Tell a health care provider about: Any allergies you have. All medicines you are taking, including vitamins, herbs, eye drops, creams, and over-the-counter medicines. Any problems you or family members have had with anesthetic medicines. Any blood disorders you have. Any surgeries you have had. Any medical conditions you have. Whether you are pregnant or may be  pregnant. What are the risks? Generally, this is a safe procedure. However, problems may occur, including: Harm to a pregnant woman and her unborn baby. This test involves the use of radiation. Radiation exposure can be dangerous to a pregnant woman and her unborn baby. If you are pregnant, you generally should not have this procedure done. Slight increase in the risk of cancer. This is because of the radiation involved in the test. What happens before the procedure? No preparation is needed for this procedure. What happens during the procedure? You will undress and remove any jewelry around your neck or chest. You will put on a hospital gown. Sticky electrodes will be placed on your chest. The electrodes will be connected to an electrocardiogram (ECG) machine to record a tracing of the electrical activity of your heart. A CT scanner will take pictures of your heart. During this time, you will be asked to lie still and hold your breath for 2-3 seconds while a picture of your heart is being taken. The procedure may vary among health care providers and hospitals. What happens after the procedure? You can get dressed. You can return to your normal activities. It is up to you to get the results of your test. Ask your health care provider, or the department that is doing the test, when your results will be ready. Summary A coronary calcium scan is an imaging test used to look for deposits of calcium and other fatty materials (plaques) in the inner lining of the blood vessels of the heart (coronary arteries). Generally, this is a safe procedure. Tell your health care provider if you are pregnant or may be pregnant. No preparation is needed for this procedure. A CT scanner will take pictures of your heart. You can return to your normal activities after the scan is done. This information is not intended to replace advice given to you by your health care provider. Make sure you discuss any questions  you have with your health care provider. Document Released: 10/06/2007 Document Revised: 02/27/2016 Document Reviewed: 02/27/2016 Elsevier Interactive Patient Education  2017 [employer]    Follow-Up: At [facility], you and your health needs are our priority.  As part of our continuing mission to provide you with exceptional heart care, we have created designated Provider Care Teams.  These Care Teams include your primary Cardiologist (physician) and Advanced Practice Providers (APPs -  Physician Assistants and Nurse Practitioners) who all work together to provide you with the care you need, when you need it.     Your next appointment:   4 month(s)  The format for your next appointment:   In Person  Provider:   Bryan Lemmaavid Lashaun Krapf, MD    Other Instructions      Bryan Lemmaavid Leanny Moeckel, M.D., M.S. Interventional Cardiologist   Pager # [phone] Phone # [phone] [address] [place] [address]   Thank you for choosing [facility] at [facility]!!

## 2021-09-11 NOTE — Patient Instructions (Addendum)
Medication Instructions:    No other changes except HCTZ  or  Amlodipine  = only use one or the other if systolic Blood pressure is greater than 140 /?   *If you need a refill on your cardiac medications before your next appointment, please call your pharmacy*   Lab Work: Not needed    Testing/Procedures: CT coronary calcium score.   Test locations:  [facility] ([address] [place], [place] [address]) [facility] ([address] [place], [place] [address])   This is $99 out of pocket.   Coronary CalciumScan A coronary calcium scan is an imaging test used to look for deposits of calcium and other fatty materials (plaques) in the inner lining of the blood vessels of the heart (coronary arteries). These deposits of calcium and plaques can partly clog and narrow the coronary arteries without producing any symptoms or warning signs. This puts a person at risk for a heart attack. This test can detect these deposits before symptoms develop. Tell a health care provider about: Any allergies you have. All medicines you are taking, including vitamins, herbs, eye drops, creams, and over-the-counter medicines. Any problems you or family members have had with anesthetic medicines. Any blood disorders you have. Any surgeries you have had. Any medical conditions you have. Whether you are pregnant or may be pregnant. What are the risks? Generally, this is a safe procedure. However, problems may occur, including: Harm to a pregnant woman and her unborn baby. This test involves the use of radiation. Radiation exposure can be dangerous to a pregnant woman and her unborn baby. If you are pregnant, you generally should not have this procedure done. Slight increase in the risk of cancer. This is because of the radiation involved in the test. What happens before the procedure? No preparation is needed for this procedure. What happens during the procedure? You will undress and  remove any jewelry around your neck or chest. You will put on a hospital gown. Sticky electrodes will be placed on your chest. The electrodes will be connected to an electrocardiogram (ECG) machine to record a tracing of the electrical activity of your heart. A CT scanner will take pictures of your heart. During this time, you will be asked to lie still and hold your breath for 2-3 seconds while a picture of your heart is being taken. The procedure may vary among health care providers and hospitals. What happens after the procedure? You can get dressed. You can return to your normal activities. It is up to you to get the results of your test. Ask your health care provider, or the department that is doing the test, when your results will be ready. Summary A coronary calcium scan is an imaging test used to look for deposits of calcium and other fatty materials (plaques) in the inner lining of the blood vessels of the heart (coronary arteries). Generally, this is a safe procedure. Tell your health care provider if you are pregnant or may be pregnant. No preparation is needed for this procedure. A CT scanner will take pictures of your heart. You can return to your normal activities after the scan is done. This information is not intended to replace advice given to you by your health care provider. Make sure you discuss any questions you have with your health care provider. Document Released: 10/06/2007 Document Revised: 02/27/2016 Document Reviewed: 02/27/2016 Elsevier Interactive Patient Education  2017 [employer].    Follow-Up: At [facility], you and your health needs  are our priority.  As part of our continuing mission to provide you with exceptional heart care, we have created designated Provider Care Teams.  These Care Teams include your primary Cardiologist (physician) and Advanced Practice Providers (APPs -  Physician Assistants and Nurse Practitioners) who all work together to provide  you with the care you need, when you need it.     Your next appointment:   4 month(s)  The format for your next appointment:   In Person  Provider:   Glenetta Hew, MD    Other Instructions

## 2021-09-17 ENCOUNTER — Encounter: Payer: Self-pay | Admitting: Cardiology

## 2021-09-17 DIAGNOSIS — Z8249 Family history of ischemic heart disease and other diseases of the circulatory system: Secondary | ICD-10-CM | POA: Insufficient documentation

## 2021-09-17 NOTE — Assessment & Plan Note (Signed)
He is agreeable to proceed with risk stratification using Coronary Calcium Score.  This will help [place] determine target LDL levels

## 2021-09-17 NOTE — Assessment & Plan Note (Signed)
Blood pressure readings were all over the place but there were some low recordings.  As such we have discontinued amlodipine.  He is worried about what should happen if he were to go back to having high blood pressures.  Plan: Continue low-dose HCTZ as the only standing medication.  He still has amlodipine and we will continue with the 2.5 mg load continues for blood pressure greater than 140 mmHg.  Could also take additional 12.5 mg HCTZ if pressures are elevated in the morning prior to dosing.Marland Kitchen

## 2021-09-17 NOTE — Assessment & Plan Note (Signed)
He has a significant family history of CAD.  He had a pretty amazing response on his lipid labs on a combination of lovastatin and Zetia.  Currently taking lovastatin every other day.  With Brabham history, we talked about risk stratification with coronary CTA to determine goals of care.  We talked about with his family agree that he probably should go forward with the study.  We will therefore schedule Coronary Calcium Score

## 2021-09-25 ENCOUNTER — Ambulatory Visit (HOSPITAL_BASED_OUTPATIENT_CLINIC_OR_DEPARTMENT_OTHER)
Admission: RE | Admit: 2021-09-25 | Discharge: 2021-09-25 | Disposition: A | Discharge status: Home / Self Care | Payer: Commercial Managed Care - PPO | Source: Ambulatory Visit | Attending: Cardiology | Admitting: Cardiology

## 2021-09-25 DIAGNOSIS — E785 Hyperlipidemia, unspecified: Secondary | ICD-10-CM | POA: Insufficient documentation

## 2021-09-25 DIAGNOSIS — I10 Essential (primary) hypertension: Secondary | ICD-10-CM | POA: Insufficient documentation

## 2021-10-18 ENCOUNTER — Other Ambulatory Visit: Payer: Commercial Managed Care - PPO

## 2021-12-11 ENCOUNTER — Other Ambulatory Visit: Payer: Self-pay | Admitting: Cardiology

## 2022-01-10 ENCOUNTER — Other Ambulatory Visit: Payer: Self-pay | Admitting: Cardiology

## 2022-01-23 ENCOUNTER — Ambulatory Visit: Payer: Commercial Managed Care - PPO | Attending: Cardiology | Admitting: Cardiology

## 2022-01-23 ENCOUNTER — Encounter: Payer: Self-pay | Admitting: Cardiology

## 2022-01-23 VITALS — BP 170/102 | HR 86 | Ht 67.0 in | Wt 175.2 lb

## 2022-01-23 DIAGNOSIS — I10 Essential (primary) hypertension: Secondary | ICD-10-CM | POA: Diagnosis not present

## 2022-01-23 DIAGNOSIS — E785 Hyperlipidemia, unspecified: Secondary | ICD-10-CM | POA: Diagnosis not present

## 2022-01-23 DIAGNOSIS — Z8249 Family history of ischemic heart disease and other diseases of the circulatory system: Secondary | ICD-10-CM

## 2022-01-23 MED ORDER — EZETIMIBE 10 MG PO TABS: ORAL_TABLET | ORAL | Status: DC

## 2022-01-23 MED ORDER — AMLODIPINE BESYLATE 2.5 MG PO TABS: ORAL_TABLET | ORAL | 2 refills | Status: DC

## 2022-01-23 NOTE — Patient Instructions (Addendum)
Medication Instructions:    Start taking HCTZ every day   Only take Amlodipine 2.5 mg  in the afternoon if blood pressure 161 systolic   *If you need a refill on your cardiac medications before your next appointment, please call your pharmacy*   Lab Work: Not needed    Testing/Procedures: Not needed   Follow-Up: At [facility], you and your health needs are our priority.  As part of our continuing mission to provide you with exceptional heart care, we have created designated Provider Care Teams.  These Care Teams include your primary Cardiologist (physician) and Advanced Practice Providers (APPs -  Physician Assistants and Nurse Practitioners) who all work together to provide you with the care you need, when you need it.     Your next appointment:   6 month(s)  The format for your next appointment:   In Person  Provider:   Glenetta Hew, MD    OTHER  Your physician discussed the importance of regular exercise and recommended that you start or continue a regular exercise program for good health.

## 2022-01-23 NOTE — Progress Notes (Signed)
Primary Care Provider: Joycelyn Rua, MD [facility] Cardiologist: Bryan Lemma, MD Electrophysiologist: None  Referring Provider: Joycelyn Rua, MD  Clinic Note: Chief Complaint  Patient presents with   Follow-up    Discussed test results.  Hypertension hyperlipidemia.   ===================================  ASSESSMENT/PLAN   Problem List Items Addressed This Visit       Cardiology Problems   Essential hypertension (Chronic)    So with his blood pressure of 170/102, we clearly need to have a baseline treatment plan.  Plan: Continue to check blood pressures daily for least a month For now start back HCTZ 12.5 mg daily. For SBP > 140 take amlodipine 2.5 mg daily.  Based on the looks with this regimen, we can determine if we need to be more aggressive.      Relevant Medications   ezetimibe (ZETIA) 10 MG tablet   amLODipine (NORVASC) 2.5 MG tablet   Hyperlipidemia with target LDL less than 100 (Chronic)    Significant family history of CAD as well as dyslipidemia.  Actually his lipids did not look that bad when he was taking lovastatin and Zetia, he is no longer taking them.  At this point he really does not want take statins because of everything his heart about them.  I explained to him that lovastatin is a very weak medication without much in the way of side effects.  He still wants to hold off.  Plan: Thankfully, his Coronary Calcium Score was 0 which is relatively reassuring.  We have time to make decisions. Just diet and exercise-we discussed salty 6 diet and heart healthy diet.  Encouraged increased exercise.  Plan will be to reassess lipids at the 45-month time point.  We can then determine whether or not he needs to be on anything for lipids.  Take Zetia 10 mg 2 to 3 days a week..  =>  Will have labs checked prior to next f/u unless PCP checks them      Relevant Medications   ezetimibe (ZETIA) 10 MG tablet   amLODipine (NORVASC) 2.5 MG tablet      Other   Family history of early CAD - Primary (Chronic)    Thankfully, Coronary Calcium Score is 0 which gives [place] time to make adjustments.  He would like to see how much can do without medications.  Follow symptoms.  No real need to really assess Coronary Calcium Score anytime soon.      ===================================  HPI:    Donald Hubbard is a 61 y.o. male with a PMH notable for HTN, HLD, with significant family history of CAD, along with ADD/anxiety who presents today for 70-month follow-up.  Donald Hubbard was seen on 09/11/2021 -> noted that his blood pressure readings were all over the place on his home recording.  We decided to discontinue amlodipine and the plan was to use that as needed for SBP > 140 mmHg  Could also take additional 12.5 mg HCTZ.  Coronary Calcium Score ordered for risk stratification.  Recent Hospitalizations: None  Reviewed  CV studies:    The following studies were reviewed today: (if available, images/films reviewed: From Epic Chart or Care Everywhere) Coronary Calcium Score 09/26/2021: Score 0.  Interval History:   Donald Hubbard returns today to discuss results of his Coronary Calcium Score, but he pretty much decided to stop taking most of his medications.  He is not taking the amlodipine anymore as we discussed, but he is also not taking Zetia, lovastatin or HCTZ.  He  cannot explain to me why.  He just is not taking them.  He tells me that his PCP told him that he could stop taking them? (That seems somewhat counterintuitive since he was referred to me).  Thankfully, he is seems to doing better over a cardiac standpoint.  Still has intermittent episodes of tachycardia spells as well as some exertional dyspnea but no chest pain or pressure. He tells me that he has not really been checking his pressures that much.  CV Review of Symptoms (Summary): positive for - dyspnea on exertion, irregular heartbeat, palpitations, rapid heart rate, and lipids are  relatively infrequent episodes of palpitations.  Nothing prolonged.  Still has dizziness and lightheadedness when his blood pressure gets low, has not really been happening since he stopped taking medications. negative for - chest pain, loss of consciousness, orthopnea, paroxysmal nocturnal dyspnea, shortness of breath, or near syncope, TIA/amaurosis fugax or claudication.  REVIEWED OF SYSTEMS   Review of Systems  Constitutional:  Positive for malaise/fatigue (He feels better since he is not taking medications.).  HENT:  Negative for nosebleeds.   Cardiovascular:  Positive for palpitations. Negative for claudication.       Per HPI  Gastrointestinal:  Positive for heartburn. Negative for blood in stool and melena.  Genitourinary:  Negative for hematuria.  Neurological:  Positive for dizziness. Negative for focal weakness and weakness.  Psychiatric/Behavioral:  Negative for memory loss. The patient is nervous/anxious. The patient does not have insomnia.   All other systems reviewed and are negative.  Intermittent malaise/fatigue with both high and low pressures.  Dizziness with low blood pressures.  Intermittent heartburn.  I have reviewed and (if needed) personally updated the patient's problem list, medications, allergies, past medical and surgical history, social and family history.   PAST MEDICAL HISTORY   Past Medical History:  Diagnosis Date   Adult ADHD    Chronic pain    GERD (gastroesophageal reflux disease)    High cholesterol    History of viral pericarditis 2000   virus caused swelling and fluid to build up around the heart   Hypertension    no meds   Sciatic nerve pain     PAST SURGICAL HISTORY   Past Surgical History:  Procedure Laterality Date   BREAST BIOPSY Left 12/03/2019   Procedure: LEFT EXCISIONAL BREAST BIOPSY;  Surgeon: Manus Rudd, MD;  Location:  [facility];  Service: General;  Laterality: Left;  LMA, PEC BLOCK   NASAL SINUS SURGERY      Periodontal surgery     ROTATOR CUFF REPAIR  05/28/2017   Dr. Rennis Chris     There is no immunization history on file for this patient.  MEDICATIONS/ALLERGIES   Current Meds  Medication Sig   acyclovir (ZOVIRAX) 800 MG tablet Take 1 tablet by mouth 2 (two) times daily as needed.   albuterol (VENTOLIN HFA) 108 (90 Base) MCG/ACT inhaler Inhale into the lungs every 6 (six) hours as needed for wheezing or shortness of breath.   amLODipine (NORVASC) 2.5 MG tablet -> NOT TAKING   amphetamine-dextroamphetamine (ADDERALL XR) 15 MG 24 hr capsule Take 15 mg by mouth every morning.   cetirizine (ZYRTEC) 10 MG tablet Take 1 tablet by mouth as needed.   Cholecalciferol (VITAMIN D-3) 125 MCG (5000 UT) TABS Take 1 tablet by mouth daily.   DiazePAM (VALIUM PO) Take 5 mg by mouth as needed.   fluticasone (FLONASE) 50 MCG/ACT nasal spray Place 1 spray into both nostrils as needed.  GNP MELATONIN PO Take 10 mg by mouth.   hydrochlorothiazide (MICROZIDE) 12.5 MG capsule TAKE 1 CAPSULE BY MOUTH EVERY DAY -> NOT TAKING   HYDROcodone-acetaminophen (NORCO/VICODIN) 5-325 MG tablet Take 1 tablet by mouth every 6 (six) hours as needed for moderate pain.   ipratropium (ATROVENT) 0.06 % nasal spray Place 1 spray into both nostrils daily.   lansoprazole (PREVACID) 15 MG capsule Take 15 mg by mouth daily at 12 noon.  Not taking ezetimibe, or lovastatin.   Allergies  Allergen Reactions   Atenolol     Other reaction(s): bradycardia Other reaction(s): bradycardia   Doxazosin Mesylate     Other reaction(s): hypotension Other reaction(s): hypotension   Lisinopril     Other reaction(s): itching Other reaction(s): itching   Pravastatin     Other reaction(s): Unknown Other reaction(s): muscle aches   Rosuvastatin     Other reaction(s): myalgias Other reaction(s): myalgias    SOCIAL HISTORY/FAMILY HISTORY   Reviewed in Epic:  Pertinent findings:  Social History   Tobacco Use   Smoking status: Never    Smokeless tobacco: Never  Substance Use Topics   Alcohol use: Yes    Comment: daily, 2 drinks   Drug use: No   Social History   Social History Narrative   Social Hx: no tobacco, 2 drinks 3-4 times per week; starbucks coffee 2-3 cups daily (mostly in the mornings, tries to stop by lunchtime)       Diet: mostly home cooked, on weekdays usually only eats dinner- good mix of meat and fresh veggies (chicken and veggies), will eat breakfast on weekends       Exercise: not reglar - started counting steps - taking stairs more         OBJCTIVE -PE, EKG, labs   Wt Readings from Last 3 Encounters:  01/23/22 175 lb 3.2 oz (79.5 kg)  09/11/21 174 lb 6.4 oz (79.1 kg)  07/20/21 174 lb (78.9 kg)    Physical Exam: BP (!) 170/102   Pulse 86   Ht 5\' 7"  (1.702 m)   Wt 175 lb 3.2 oz (79.5 kg)   SpO2 97%   BMI 27.44 kg/m  Physical Exam Vitals reviewed.  Constitutional:      General: He is not in acute distress.    Appearance: Normal appearance. He is normal weight. He is not ill-appearing or toxic-appearing.  Neck:     Vascular: No carotid bruit.  Cardiovascular:     Rate and Rhythm: Normal rate and regular rhythm.     Pulses: Normal pulses.     Heart sounds: Normal heart sounds. No murmur heard.    No friction rub. No gallop.  Pulmonary:     Effort: Pulmonary effort is normal. No respiratory distress.     Breath sounds: Normal breath sounds. No wheezing, rhonchi or rales.  Chest:     Chest wall: No tenderness.  Musculoskeletal:        General: No swelling. Normal range of motion.     Cervical back: Normal range of motion and neck supple.  Skin:    General: Skin is warm and dry.  Neurological:     General: No focal deficit present.     Mental Status: He is alert and oriented to person, place, and time. Mental status is at baseline.  Psychiatric:        Mood and Affect: Mood normal.        Behavior: Behavior normal.        Thought Content:  Thought content normal.         Judgment: Judgment normal.     Adult ECG Report N/a  Recent Labs: Reviewed. Lab Results  Component Value Date   CHOL 170 09/05/2021   HDL 60 09/05/2021   LDLCALC 89 09/05/2021   TRIG 118 09/05/2021   CHOLHDL 2.8 09/05/2021   Lab Results  Component Value Date   CREATININE 1.35 (H) 09/05/2021   BUN 15 09/05/2021   NA 144 09/05/2021   K 4.5 09/05/2021   CL 102 09/05/2021   CO2 26 09/05/2021      Latest Ref Rng & Units 06/10/2017    3:33 PM  CBC  WBC 4.0 - 10.5 K/uL 5.9   Hemoglobin 13.0 - 17.0 g/dL 14.9   Hematocrit 39.0 - 52.0 % 43.7   Platelets 150 - 400 K/uL 158     No results found for: "HGBA1C" Lab Results  Component Value Date   TSH 0.852 09/05/2021    ================================================== I spent a total of 16minutes with the patient spent in direct patient consultation.  We spent a long tract  Issues with his medications discussing his concerns do not take medications.  We reviewed the Coronary Calcium Score results. Additional time spent with chart review  / charting (studies, outside notes, etc): 12 min Total Time: 41 min  Current medicines are reviewed at length with the patient today.  (+/- concerns) n/a  Notice: This dictation was prepared with Dragon dictation along with smart phrase technology. Any transcriptional errors that result from this process are unintentional and may not be corrected upon review.  Studies Ordered:   No orders of the defined types were placed in this encounter.  Meds ordered this encounter  Medications   ezetimibe (ZETIA) 10 MG tablet    Sig: Take  10 mg tablet  2 to 3 times a week   amLODipine (NORVASC) 2.5 MG tablet    Sig: Take 2.5 mg  tablet in the afternoon if blood pressure is above 035 systolic ,    Dispense:  597 tablet    Refill:  2    Patient Instructions / Medication Changes & Studies & Tests Ordered   Salty 6 Diet    Leonie Man, MD, MS Glenetta Hew, M.D., M.S. Interventional  Cardiologist  [place]  Pager # [phone] Phone # [phone] [address] [place], [place] [address]   Thank you for choosing [facility] [place]!!

## 2022-02-11 ENCOUNTER — Encounter: Payer: Self-pay | Admitting: Cardiology

## 2022-02-11 NOTE — Assessment & Plan Note (Addendum)
Significant family history of CAD as well as dyslipidemia.  Actually his lipids did not look that bad when he was taking lovastatin and Zetia, he is no longer taking them.  At this point he really does not want take statins because of everything his heart about them.  I explained to him that lovastatin is a very weak medication without much in the way of side effects.  He still wants to hold off.  Plan: Thankfully, his Coronary Calcium Score was 0 which is relatively reassuring.  We have time to make decisions.  Just diet and exercise-we discussed salty 6 diet and heart healthy diet.  Encouraged increased exercise.  Plan will be to reassess lipids at the 49-month time point.  We can then determine whether or not he needs to be on anything for lipids.   Take Zetia 10 mg 2 to 3 days a week..  =>  Will have labs checked prior to next f/u unless PCP checks them

## 2022-02-11 NOTE — Assessment & Plan Note (Signed)
Thankfully, Coronary Calcium Score is 0 which gives [place] time to make adjustments.  He would like to see how much can do without medications.  Follow symptoms.  No real need to really assess Coronary Calcium Score anytime soon.

## 2022-02-11 NOTE — Assessment & Plan Note (Signed)
So with his blood pressure of 170/102, we clearly need to have a baseline treatment plan.  Plan: Continue to check blood pressures daily for least a month  For now start back HCTZ 12.5 mg daily.  For SBP > 140 take amlodipine 2.5 mg daily.  Based on the looks with this regimen, we can determine if we need to be more aggressive.

## 2022-02-28 ENCOUNTER — Telehealth: Payer: Self-pay | Admitting: *Deleted

## 2022-02-28 NOTE — Telephone Encounter (Signed)
Called patient - no answer.- left message ( per Dr Herbie Baltimore note from last office visit)    Mailing labslip ( lipid)  to be done in April 2023 any question may call back

## 2022-02-28 NOTE — Addendum Note (Signed)
Addended by: Tobin Chad on: 02/28/2022 03:23 PM   Modules accepted: Orders

## 2022-07-13 ENCOUNTER — Other Ambulatory Visit: Payer: Self-pay | Admitting: Cardiology

## 2022-08-01 ENCOUNTER — Encounter: Payer: Self-pay | Admitting: Cardiology

## 2022-08-06 LAB — LIPID PANEL
Chol/HDL Ratio: 5.8 ratio — ABNORMAL HIGH (ref 0.0–5.0)
Cholesterol, Total: 337 mg/dL — ABNORMAL HIGH (ref 100–199)
HDL: 58 mg/dL (ref >39)
LDL Chol Calc (NIH): 246 mg/dL — ABNORMAL HIGH (ref 0–99)
Triglycerides: 172 mg/dL — ABNORMAL HIGH (ref 0–149)
VLDL Cholesterol Cal: 33 mg/dL (ref 5–40)

## 2022-08-13 ENCOUNTER — Ambulatory Visit: Payer: Commercial Managed Care - PPO | Attending: Cardiology | Admitting: Cardiology

## 2022-08-13 ENCOUNTER — Encounter: Payer: Self-pay | Admitting: Cardiology

## 2022-08-13 VITALS — BP 140/80 | HR 85 | Ht 65.0 in | Wt 190.0 lb

## 2022-08-13 DIAGNOSIS — Z8249 Family history of ischemic heart disease and other diseases of the circulatory system: Secondary | ICD-10-CM | POA: Diagnosis not present

## 2022-08-13 DIAGNOSIS — I10 Essential (primary) hypertension: Secondary | ICD-10-CM

## 2022-08-13 DIAGNOSIS — G720 Drug-induced myopathy: Secondary | ICD-10-CM | POA: Diagnosis not present

## 2022-08-13 DIAGNOSIS — E785 Hyperlipidemia, unspecified: Secondary | ICD-10-CM

## 2022-08-13 DIAGNOSIS — T466X5A Adverse effect of antihyperlipidemic and antiarteriosclerotic drugs, initial encounter: Secondary | ICD-10-CM

## 2022-08-13 NOTE — Patient Instructions (Addendum)
Medication Instructions:   Keep taking medication as discussed  *If you need a refill on your cardiac medications before your next appointment, please call your pharmacy*   Lab Work: Not  needed    Testing/Procedures: Not needed   Follow-Up: At [facility], you and your health needs are our priority.  As part of our continuing mission to provide you with exceptional heart care, we have created designated Provider Care Teams.  These Care Teams include your primary Cardiologist (physician) and Advanced Practice Providers (APPs -  Physician Assistants and Nurse Practitioners) who all work together to provide you with the care you need, when you need it.     Your next appointment:    8 to 9 month(s) Dec /Jan 2025)  The format for your next appointment:   In Person  Provider:   Bryan Lemma, MD   You have been referred to  LIPID clinic with Dr Rennis Golden  - discuss using Jeanene Erb

## 2022-08-13 NOTE — Progress Notes (Unsigned)
Primary Care Provider: Joycelyn Rua, MD [facility] Cardiologist: Bryan Lemma, MD Electrophysiologist: None  Clinic Note: No chief complaint on file.   ===================================  ASSESSMENT/PLAN   Problem List Items Addressed This Visit       Cardiology Problems   Hyperlipidemia with target LDL less than 100 - Primary (Chronic)   Relevant Orders   AMB Referral to Advanced Lipid Disorders Clinic   Essential hypertension (Chronic)   Relevant Orders   EKG 12-Lead     Other   Statin myopathy   Family history of early CAD (Chronic)   Relevant Orders   EKG 12-Lead   AMB Referral to Advanced Lipid Disorders Clinic    ===================================  HPI:    Donald Hubbard is a 62 y.o. male with a PMH  notable for HTN, HLD, with significant family history of CAD, along with ADD/anxiety who presents today for ***.  Donald Hubbard was last seen on ***  Recent Hospitalizations: ***  Reviewed  CV studies:    The following studies were reviewed today: (if available, images/films reviewed: From Epic Chart or Care Everywhere) ***:  Interval History:   Donald Hubbard   CV Review of Symptoms (Summary): {[id number]}  REVIEWED OF SYSTEMS   ROS  I have reviewed and (if needed) personally updated the patient's problem list, medications, allergies, past medical and surgical history, social and family history.   PAST MEDICAL HISTORY   Past Medical History:  Diagnosis Date   Adult ADHD    Chronic pain    GERD (gastroesophageal reflux disease)    High cholesterol    History of viral pericarditis 2000   virus caused swelling and fluid to build up around the heart   Hypertension    no meds   Sciatic nerve pain     PAST SURGICAL HISTORY   Past Surgical History:  Procedure Laterality Date   BREAST BIOPSY Left 12/03/2019   Procedure: LEFT EXCISIONAL BREAST BIOPSY;  Surgeon: Manus Rudd, MD;  Location: [facility];  Service:  General;  Laterality: Left;  LMA, PEC BLOCK   NASAL SINUS SURGERY     Periodontal surgery     ROTATOR CUFF REPAIR  05/28/2017   Dr. Rennis Chris     There is no immunization history on file for this patient.  MEDICATIONS/ALLERGIES   Current Meds  Medication Sig   acyclovir (ZOVIRAX) 800 MG tablet Take 1 tablet by mouth 2 (two) times daily as needed.   albuterol (VENTOLIN HFA) 108 (90 Base) MCG/ACT inhaler Inhale into the lungs every 6 (six) hours as needed for wheezing or shortness of breath.   amLODipine (NORVASC) 2.5 MG tablet Take 2.5 mg  tablet in the afternoon if blood pressure is above 140 systolic ,   amphetamine-dextroamphetamine (ADDERALL XR) 15 MG 24 hr capsule Take 15 mg by mouth every morning.   cetirizine (ZYRTEC) 10 MG tablet Take 1 tablet by mouth as needed.   Cholecalciferol (VITAMIN D-3) 125 MCG (5000 UT) TABS Take 1 tablet by mouth daily.   DiazePAM (VALIUM PO) Take 5 mg by mouth as needed.   ezetimibe (ZETIA) 10 MG tablet Take  10 mg tablet  2 to 3 times a week   Fexofenadine HCl (MUCINEX ALLERGY PO) Take 1,200 mcg by mouth in the morning.   fluticasone (FLONASE) 50 MCG/ACT nasal spray Place 1 spray into both nostrils as needed.   GNP MELATONIN PO Take 10 mg by mouth.   hydrochlorothiazide (MICROZIDE) 12.5 MG capsule TAKE 1 CAPSULE  BY MOUTH EVERY DAY   HYDROcodone-acetaminophen (NORCO/VICODIN) 5-325 MG tablet Take 1 tablet by mouth every 6 (six) hours as needed for moderate pain.   ipratropium (ATROVENT) 0.06 % nasal spray Place 1 spray into both nostrils daily.   lansoprazole (PREVACID) 15 MG capsule Take 15 mg by mouth daily at 12 noon.   levocetirizine (XYZAL) 5 MG tablet Take 5 mg by mouth every evening.   MAGNESIUM GLYCINATE PO Take 350 mg by mouth at bedtime.   Menatetrenone (VITAMIN K2) 100 MCG TABS Take 100 mg by mouth daily.    Allergies  Allergen Reactions   Atenolol     Other reaction(s): bradycardia Other reaction(s): bradycardia   Doxazosin Mesylate      Other reaction(s): hypotension Other reaction(s): hypotension   Lisinopril     Other reaction(s): itching Other reaction(s): itching   Pravastatin     Other reaction(s): Unknown Other reaction(s): muscle aches   Rosuvastatin     Other reaction(s): myalgias Other reaction(s): myalgias   Zetia [Ezetimibe]     Myalgias - hans ache     SOCIAL HISTORY/FAMILY HISTORY   Reviewed in Epic:  Pertinent findings:  Social History   Tobacco Use   Smoking status: Never   Smokeless tobacco: Never  Substance Use Topics   Alcohol use: Yes    Comment: daily, 2 drinks   Drug use: No   Social History   Social History Narrative   Social Hx: no tobacco, 2 drinks 3-4 times per week; starbucks coffee 2-3 cups daily (mostly in the mornings, tries to stop by lunchtime)       Diet: mostly home cooked, on weekdays usually only eats dinner- good mix of meat and fresh veggies (chicken and veggies), will eat breakfast on weekends       Exercise: not reglar - started counting steps - taking stairs more         OBJCTIVE -PE, EKG, labs   Wt Readings from Last 3 Encounters:  08/13/22 190 lb (86.2 kg)  01/23/22 175 lb 3.2 oz (79.5 kg)  09/11/21 174 lb 6.4 oz (79.1 kg)    Physical Exam: BP (!) 140/80 (BP Location: Left Arm, Patient Position: Sitting, Cuff Size: Normal)   Pulse 85   Ht  (1.651 m)   Wt 190 lb (86.2 kg)   SpO2 99%   BMI 31.62 kg/m  Physical Exam   Adult ECG Report  Rate: *** ;  Rhythm: {rhythm:17366};   Narrative Interpretation: ***  Recent Labs:  ***  Lab Results  Component Value Date   CHOL 337 (H) 08/06/2022   HDL 58 08/06/2022   LDLCALC 246 (H) 08/06/2022   TRIG 172 (H) 08/06/2022   CHOLHDL 5.8 (H) 08/06/2022   Lab Results  Component Value Date   CREATININE 1.35 (H) 09/05/2021   BUN 15 09/05/2021   NA 144 09/05/2021   K 4.5 09/05/2021   CL 102 09/05/2021   CO2 26 09/05/2021      Latest Ref Rng & Units 06/10/2017    3:33 PM  CBC  WBC 4.0 - 10.5  K/uL 5.9   Hemoglobin 13.0 - 17.0 g/dL 16.1   Hematocrit 09.6 - 52.0 % 43.7   Platelets 150 - 400 K/uL 158     No results found for: "HGBA1C" Lab Results  Component Value Date   TSH 0.852 09/05/2021    ================================================== I spent a total of ***minutes with the patient spent in direct patient consultation.  Additional time spent with chart  review  / charting (studies, outside notes, etc): *** min Total Time: *** min  Current medicines are reviewed at length with the patient today.  (+/- concerns) ***  Notice: This dictation was prepared with Dragon dictation along with smart phrase technology. Any transcriptional errors that result from this process are unintentional and may not be corrected upon review.  Studies Ordered:   Orders Placed This Encounter  Procedures   AMB Referral to Advanced Lipid Disorders Clinic   EKG 12-Lead   No orders of the defined types were placed in this encounter.   Patient Instructions / Medication Changes & Studies & Tests Ordered   Patient Instructions  Medication Instructions:   Keep taking medication as discussed  *If you need a refill on your cardiac medications before your next appointment, please call your pharmacy*   Lab Work:  If you have labs (blood work) drawn today and your tests are completely normal, you will receive your results only by: MyChart Message (if you have MyChart) OR A paper copy in the mail If you have any lab test that is abnormal or we need to change your treatment, we will call you to review the results.   Testing/Procedures: Not needed   Follow-Up: At [facility], you and your health needs are our priority.  As part of our continuing mission to provide you with exceptional heart care, we have created designated Provider Care Teams.  These Care Teams include your primary Cardiologist (physician) and Advanced Practice Providers (APPs -  Physician Assistants and Nurse  Practitioners) who all work together to provide you with the care you need, when you need it.     Your next appointment:    8 to 9 month(s)  The format for your next appointment:   In Person  Provider:   Bryan Lemma, MD   You have been referred to  LIPID clinic with Dr Rennis Golden  - discuss using Jeanene Erb  Other Instructions      Marykay Lex, MD, MS Bryan Lemma, M.D., M.S. Interventional Cardiologist  [facility]  Pager # [phone] Phone # [phone] [address] [place], [place] [address]   Thank you for choosing [facility] [place]!!

## 2022-08-14 ENCOUNTER — Encounter: Payer: Self-pay | Admitting: Cardiology

## 2022-08-14 NOTE — Assessment & Plan Note (Signed)
Very unusual labile blood pressure.  I based on what he felt me at home having low system blood pressures in the 90s, I am leery of doing any more than the stimulants and HCTZ.  However he does have systolic blood pressure that can skyrocket up to the 170s.  As such, we still have the PRN amlodipine dose that he can take if his SBP is higher than 140:  I am leery of being more aggressive than this for now.

## 2022-08-14 NOTE — Assessment & Plan Note (Addendum)
He has now tried pravastatin, rosuvastatin, lovastatin, atorvastatin simvastatin all of which have led to myalgias and hand pain but all stopped 20 stopped taking the medication.  He also is even intolerant having similar symptoms to Zetia which mechanistically it still does not make sense, but he insist the symptoms were there.  With significant elevated lipid panel, even though his Coronary Calcium Score is only 0 I think he probably needs to be on definitive regimen to treat.  Will refer to [facility]

## 2022-08-14 NOTE — Assessment & Plan Note (Signed)
Suprabasal his family history of CAD, I think is reasonable to treat for an LDL less than 100.  The Coronary Calcium Score 0 is very reassuring, but the significant lipid panel with an LDL of 246 and total cholesterol of 337 is probably more than we would want to see.  Intolerant of just any statin as well as Zetia.  Referring to [facility] That-consider PCSK9 inhibitors versus potentially even inclisiran.  (Apparently his wife uses PCSK9 inhibitor.

## 2022-08-17 ENCOUNTER — Encounter: Payer: Self-pay | Admitting: Cardiology

## 2022-08-18 NOTE — Telephone Encounter (Signed)
Be a good question to ask when you are seen by the lipid clinic.  The subtleties of that indication are somewhat confusing.  Donald Hubbard

## 2022-08-20 ENCOUNTER — Ambulatory Visit (HOSPITAL_BASED_OUTPATIENT_CLINIC_OR_DEPARTMENT_OTHER): Payer: Commercial Managed Care - PPO | Admitting: Internal Medicine

## 2022-08-20 ENCOUNTER — Encounter: Payer: Self-pay | Admitting: Cardiology

## 2022-08-20 ENCOUNTER — Encounter (HOSPITAL_BASED_OUTPATIENT_CLINIC_OR_DEPARTMENT_OTHER): Payer: Self-pay | Admitting: Internal Medicine

## 2022-08-20 ENCOUNTER — Telehealth: Payer: Self-pay | Admitting: Internal Medicine

## 2022-08-20 VITALS — BP 130/88 | HR 105 | Ht 65.0 in | Wt 188.0 lb

## 2022-08-20 DIAGNOSIS — E785 Hyperlipidemia, unspecified: Secondary | ICD-10-CM | POA: Diagnosis not present

## 2022-08-20 DIAGNOSIS — E7849 Other hyperlipidemia: Secondary | ICD-10-CM | POA: Diagnosis not present

## 2022-08-20 DIAGNOSIS — Z8249 Family history of ischemic heart disease and other diseases of the circulatory system: Secondary | ICD-10-CM

## 2022-08-20 DIAGNOSIS — M791 Myalgia, unspecified site: Secondary | ICD-10-CM | POA: Diagnosis not present

## 2022-08-20 DIAGNOSIS — T466X5A Adverse effect of antihyperlipidemic and antiarteriosclerotic drugs, initial encounter: Secondary | ICD-10-CM

## 2022-08-20 NOTE — Telephone Encounter (Signed)
I think a lot of it depends on his blood pressures.  I would take it based on his blood pressure and not necessarily standing. Bryan Lemma, MD

## 2022-08-20 NOTE — Telephone Encounter (Signed)
PA for Repatha Sureclick submitted via CMM (Key: [id number])

## 2022-08-20 NOTE — Progress Notes (Signed)
LIPID CLINIC CONSULT NOTE  Chief Complaint:  Manage dyslipidemia  Primary Care Physician: Joycelyn Rua, MD  Primary Cardiologist:  Bryan Lemma, MD  HPI:  Donald Hubbard is a 62 y.o. male who is being seen today for the evaluation of dyslipidemia at the request of Marykay Lex, MD. this is a pleasant 62 year old male kindly referred for evaluation management of dyslipidemia.  Unfortunately he has a strong family history of high cholesterol and heart disease.  His mother has had a stent and has high cholesterol.  His father had a four-vessel bypass.  His paternal grandmother also had coronary artery disease.  He has tried statins in the past but has been intolerant of both pravastatin and rosuvastatin as well as ezetimibe.  Most recently his lipids were significantly elevated with total cholesterol 337, HDL 58, triglycerides 172 and LDL of 246.  This is highly suggestive of a familial hyperlipidemia.  PMHx:  Past Medical History:  Diagnosis Date   Adult ADHD    Chronic pain    GERD (gastroesophageal reflux disease)    High cholesterol    History of viral pericarditis 2000   virus caused swelling and fluid to build up around the heart   Hypertension    no meds   Sciatic nerve pain     Past Surgical History:  Procedure Laterality Date   BREAST BIOPSY Left 12/03/2019   Procedure: LEFT EXCISIONAL BREAST BIOPSY;  Surgeon: Manus Rudd, MD;  Location: [facility];  Service: General;  Laterality: Left;  LMA, PEC BLOCK   NASAL SINUS SURGERY     Periodontal surgery     ROTATOR CUFF REPAIR  05/28/2017   Dr. Rennis Chris    FAMHx:  Family History  Problem Relation Age of Onset   Coronary artery disease Mother 12       Was a long-term smoker   Coronary artery disease Father 55       At CABG at age 102    SOCHx:   reports that he has never smoked. He has never used smokeless tobacco. He reports current alcohol use. He reports that he does not use  drugs.  ALLERGIES:  Allergies  Allergen Reactions   Atenolol     Other reaction(s): bradycardia Other reaction(s): bradycardia   Doxazosin Mesylate     Other reaction(s): hypotension Other reaction(s): hypotension   Lisinopril     Other reaction(s): itching Other reaction(s): itching   Pravastatin     Other reaction(s): Unknown Other reaction(s): muscle aches   Rosuvastatin     Other reaction(s): myalgias Other reaction(s): myalgias   Zetia [Ezetimibe]     Myalgias - hans ache     ROS: Pertinent items noted in HPI and remainder of comprehensive ROS otherwise negative.  HOME MEDS: Current Outpatient Medications on File Prior to Visit  Medication Sig Dispense Refill   acyclovir (ZOVIRAX) 800 MG tablet Take 1 tablet by mouth 2 (two) times daily as needed.     albuterol (VENTOLIN HFA) 108 (90 Base) MCG/ACT inhaler Inhale into the lungs every 6 (six) hours as needed for wheezing or shortness of breath.     amLODipine (NORVASC) 2.5 MG tablet Take 2.5 mg  tablet in the afternoon if blood pressure is above 140 systolic , 270 tablet 2   amphetamine-dextroamphetamine (ADDERALL XR) 15 MG 24 hr capsule Take 15 mg by mouth every morning.     cetirizine (ZYRTEC) 10 MG tablet Take 1 tablet by mouth as needed.  Cholecalciferol (VITAMIN D-3) 125 MCG (5000 UT) TABS Take 1 tablet by mouth daily.     DiazePAM (VALIUM PO) Take 5 mg by mouth as needed.     Fexofenadine HCl (MUCINEX ALLERGY PO) Take 1,200 mcg by mouth in the morning.     fluticasone (FLONASE) 50 MCG/ACT nasal spray Place 1 spray into both nostrils as needed.     GNP MELATONIN PO Take 10 mg by mouth.     hydrochlorothiazide (MICROZIDE) 12.5 MG capsule TAKE 1 CAPSULE BY MOUTH EVERY DAY 90 capsule 1   HYDROcodone-acetaminophen (NORCO/VICODIN) 5-325 MG tablet Take 1 tablet by mouth every 6 (six) hours as needed for moderate pain.     ipratropium (ATROVENT) 0.06 % nasal spray Place 1 spray into both nostrils daily.     lansoprazole  (PREVACID) 15 MG capsule Take 15 mg by mouth daily at 12 noon.     levocetirizine (XYZAL) 5 MG tablet Take 5 mg by mouth every evening.     MAGNESIUM GLYCINATE PO Take 350 mg by mouth at bedtime.     Menatetrenone (VITAMIN K2) 100 MCG TABS Take 100 mg by mouth daily.     No current facility-administered medications on file prior to visit.    LABS/IMAGING: No results found for this or any previous visit (from the past 48 hour(s)). No results found.  LIPID PANEL:    Component Value Date/Time   CHOL 337 (H) 08/06/2022 0954   TRIG 172 (H) 08/06/2022 0954   HDL 58 08/06/2022 0954   CHOLHDL 5.8 (H) 08/06/2022 0954   LDLCALC 246 (H) 08/06/2022 0954    WEIGHTS: Wt Readings from Last 3 Encounters:  08/20/22 188 lb (85.3 kg)  08/13/22 190 lb (86.2 kg)  01/23/22 175 lb 3.2 oz (79.5 kg)    VITALS: BP 130/88 (BP Location: Left Arm, Patient Position: Sitting, Cuff Size: Normal)   Pulse (!) 105   Ht 5\' 5"  (1.651 m)   Wt 188 lb (85.3 kg)   SpO2 97%   BMI 31.28 kg/m   EXAM: Deferred  EKG: Deferred  ASSESSMENT: Probable familial hyperlipidemia, LDL greater than 190, based on Simon-Broome criteria Multiple family members with high cholesterol and cardiovascular disease in both parents Statin and ezetimibe intolerant-myalgias Zero CAC score (2023)  PLAN: 1.   Mr. Surgeon has a probable familial hyperlipidemia with LDL greater than 190 and has been statin and ezetimibe intolerant.  He is a good candidate for PCSK9 inhibitor therapy.  He may also have an elevated LP(a).  I would like to check that.  Will plan to try to get prior authorization for PCSK9 inhibitor and repeat lipids including an NMR in about 3 to 4 months.  His target LDL actually is less than 70 as he is likely an FH patient, although this may be difficult to achieve.  He is also asked about possibility of weight loss medications today.  He may ultimately benefit from that but we advised him to work on diet and lifestyle  modifications first and if there is little improvement that could be considered.  Follow-up with me in about 3 to 4 months.  Thanks again for the kind referral.  Chrystie Nose, MD, [facility]  [place]  [facility]  Medical Director of the Advanced Lipid Disorders &  Cardiovascular Risk Reduction Clinic Diplomate of the American Board of Clinical Lipidology Attending Cardiologist  Direct Dial: [phone]  Fax: [phone]  Website:  [email or web address]  Lisette Abu Broghan Pannone 08/20/2022, 2:26 PM

## 2022-08-20 NOTE — Patient Instructions (Signed)
Medication Instructions:  Dr. Rennis Golden recommends Repatha Sureclick 140mg /mL (PCSK9). This is an injectable cholesterol medication self-administered once every 14 days. This medication will likely need prior approval with your insurance company, which we will work on. If the medication is not approved initially, we may need to do an appeal with your insurance.   Administer medication in area of fatty tissue such as abdomen, outer thigh, back of upper arm - and rotate site with each injection Store medication in refrigerator until ready to administer - allow to sit at room temp for 30 mins - 1 hour prior to injection Dispose of medication in a SHARPS container - your pharmacy should be able to direct you on this and proper disposal   If you need a co-pay card for Repatha: Lawsponsor.fr If you need a co-pay card for Praluent: [email or web address]  Patient Assistance:    These foundations have funds at various times.   The PAN Foundation: https://www.panfoundation.org/disease-funds/hypercholesterolemia/ -- can sign up for wait list  The [facility] offers assistance to help pay for medication copays.  They will cover copays for all cholesterol lowering meds, including statins, fibrates, omega-3 fish oils like Vascepa, ezetimibe, Repatha, Praluent, Nexletol, Nexlizet.  The cards are usually good for $2,500 or 12 months, whichever comes first. Our fax # is [phone] (you will need this to apply) Go to healthwellfoundation.org Click on "Apply Now" Answer questions as to whom is applying (patient or representative) Your disease fund will be "hypercholesterolemia - Medicare access" They will ask questions about finances and which medications you are taking for cholesterol When you submit, the approval is usually within minutes.  You will need to print the card information from the site You will need to show this information to your pharmacy,  they will bill your Medicare Part D plan first -then bill Health Well --for the copay.   You can also call them at [phone], although the hold times can be quite long.     *If you need a refill on your cardiac medications before your next appointment, please call your pharmacy*   Lab Work:  LPa and A1c today  FASTING lab work to check cholesterol in 3-4 months  If you have labs (blood work) drawn today and your tests are completely normal, you will receive your results only by: MyChart Message (if you have MyChart) OR A paper copy in the mail If you have any lab test that is abnormal or we need to change your treatment, we will call you to review the results.    Follow-Up: At [facility], you and your health needs are our priority.  As part of our continuing mission to provide you with exceptional heart care, we have created designated Provider Care Teams.  These Care Teams include your primary Cardiologist (physician) and Advanced Practice Providers (APPs -  Physician Assistants and Nurse Practitioners) who all work together to provide you with the care you need, when you need it.  We recommend signing up for the patient portal called "MyChart".  Sign up information is provided on this After Visit Summary.  MyChart is used to connect with patients for Virtual Visits (Telemedicine).  Patients are able to view lab/test results, encounter notes, upcoming appointments, etc.  Non-urgent messages can be sent to your provider as well.   To learn more about what you can do with MyChart, go to [email or web address].    Your next appointment:    3-4 months with Dr. Rennis Golden

## 2022-08-21 ENCOUNTER — Ambulatory Visit (INDEPENDENT_AMBULATORY_CARE_PROVIDER_SITE_OTHER): Payer: Commercial Managed Care - PPO | Admitting: Family Medicine

## 2022-08-21 VITALS — BP 148/83 | HR 91 | Temp 97.7°F | Ht 65.5 in | Wt 185.2 lb

## 2022-08-21 DIAGNOSIS — I10 Essential (primary) hypertension: Secondary | ICD-10-CM

## 2022-08-21 DIAGNOSIS — E785 Hyperlipidemia, unspecified: Secondary | ICD-10-CM | POA: Diagnosis not present

## 2022-08-21 DIAGNOSIS — E669 Obesity, unspecified: Secondary | ICD-10-CM

## 2022-08-21 DIAGNOSIS — Z6831 Body mass index (BMI) 31.0-31.9, adult: Secondary | ICD-10-CM | POA: Diagnosis not present

## 2022-08-21 DIAGNOSIS — Z0289 Encounter for other administrative examinations: Secondary | ICD-10-CM

## 2022-08-21 NOTE — Progress Notes (Unsigned)
Office: [phone]  /  Fax: [phone]   Initial Visit  Donald Hubbard was seen in clinic today to evaluate for obesity. He is interested in losing weight to improve overall health and reduce the risk of weight related complications. He presents today to review program treatment options, initial physical assessment, and evaluation.     He was referred by: Self-Referral  When asked what else they would like to accomplish? He states: Improve existing medical conditions, Improve quality of life, and Improve appearance  Weight history: gained 20 lb in the past 2 year-went back to work in the past 2 years Would like to lose 30 lb  When asked how has your weight affected you? He states: Having fatigue  Some associated conditions: Hypertension and Hyperlipidemia  Contributing factors: Other: poor sleep  Weight promoting medications identified: None  Current nutrition plan: None  Current level of physical activity: None and NEAT  Current or previous pharmacotherapy: None  Response to medication: Never tried medications   Past medical history includes:   Past Medical History:  Diagnosis Date   Adult ADHD    Chronic pain    GERD (gastroesophageal reflux disease)    High cholesterol    History of viral pericarditis 2000   virus caused swelling and fluid to build up around the heart   Hypertension    no meds   Sciatic nerve pain      Objective:   BP (!) 148/83   Pulse 91   Temp 97.7 F (36.5 C)   Ht 5' 5.5" (1.664 m)   Wt 185 lb 3.2 oz (84 kg)   SpO2 99%   BMI 30.35 kg/m  He was weighed on the bioimpedance scale: Body mass index is 30.35 kg/m.  Peak Weight:185 , Body Fat%:29.3, Visceral Fat Rating:17, Weight trend over the last 12 months: Increasing  General:  Alert, oriented and cooperative. Patient is in no acute distress.  Respiratory: Normal respiratory effort, no problems with respiration noted   Gait: able to ambulate independently  Mental Status: Normal  mood and affect. Normal behavior. Normal judgment and thought content.   DIAGNOSTIC DATA REVIEWED:  BMET    Component Value Date/Time   NA 144 09/05/2021 0854   K 4.5 09/05/2021 0854   CL 102 09/05/2021 0854   CO2 26 09/05/2021 0854   GLUCOSE 107 (H) 09/05/2021 0854   GLUCOSE 71 06/10/2017 1533   BUN 15 09/05/2021 0854   CREATININE 1.35 (H) 09/05/2021 0854   CALCIUM 9.3 09/05/2021 0854   GFRNONAA >60 06/10/2017 1533   GFRAA >60 06/10/2017 1533   Lab Results  Component Value Date   HGBA1C 5.9 (H) 08/20/2022   No results found for: "INSULIN" CBC    Component Value Date/Time   WBC 5.9 06/10/2017 1533   RBC 5.01 06/10/2017 1533   HGB 14.9 06/10/2017 1533   HCT 43.7 06/10/2017 1533   PLT 158 06/10/2017 1533   MCV 87.2 06/10/2017 1533   MCH 29.7 06/10/2017 1533   MCHC 34.1 06/10/2017 1533   RDW 12.5 06/10/2017 1533   Iron/TIBC/Ferritin/ %Sat No results found for: "IRON", "TIBC", "FERRITIN", "IRONPCTSAT" Lipid Panel     Component Value Date/Time   CHOL 337 (H) 08/06/2022 0954   TRIG 172 (H) 08/06/2022 0954   HDL 58 08/06/2022 0954   CHOLHDL 5.8 (H) 08/06/2022 0954   LDLCALC 246 (H) 08/06/2022 0954   Hepatic Function Panel     Component Value Date/Time   PROT 6.3 09/05/2021 0853  ALBUMIN 4.4 09/05/2021 0853   AST 23 09/05/2021 0853   ALT 33 09/05/2021 0853   ALKPHOS 65 09/05/2021 0853   BILITOT 0.8 09/05/2021 0853   BILIDIR 0.19 09/05/2021 0853      Component Value Date/Time   TSH 0.852 09/05/2021 0854     Assessment and Plan:   Class 1 obesity with body mass index (BMI) of 31.0 to 31.9 in adult, unspecified obesity type, unspecified whether serious comorbidity present  Essential hypertension Assessment & Plan: Blood pressure elevated today on amlodipine 2.5 mg once daily and HCTZ 12.5 mg once daily.  He reports good compliance taking his blood pressure medication.  He denies headache or chest pain.  Continue current blood pressure medication.  Look  for improvements with dietary change and regular exercise.   Hyperlipidemia with target LDL less than 100 Assessment & Plan: Lab Results  Component Value Date   CHOL 337 (H) 08/06/2022   HDL 58 08/06/2022   LDLCALC 246 (H) 08/06/2022   TRIG 172 (H) 08/06/2022   CHOLHDL 5.8 (H) 08/06/2022   Patient has had a long history of hyperlipidemia.  He has a history of statin myopathy.  He is managed by Dr. Rennis Golden.  Notes reviewed.  He is planning to start on Repatha soon.  Continue current plan of care per cardiology.  Begin working on a heart healthy diet and weight reduction.         Obesity Treatment / Action Plan:  Patient will work on garnering support from family and friends to begin weight loss journey. Will work on eliminating or reducing the presence of highly palatable, calorie dense foods in the home. Will complete provided nutritional and psychosocial assessment questionnaire before the next appointment. Will be scheduled for indirect calorimetry to determine resting energy expenditure in a fasting state.  This will allow [place] to create a reduced calorie, high-protein meal plan to promote loss of fat mass while preserving muscle mass. Will think about ideas on how to incorporate physical activity into their daily routine. Will work on improving sleep hygiene and trying to obtain at least 7 hours of sleep. Was counseled on nutritional approaches to weight loss and benefits of complex carbs and high quality protein as part of nutritional weight management. Was counseled on pharmacotherapy and role as an adjunct in weight management.   Obesity Education Performed Today:  He was weighed on the bioimpedance scale and results were discussed and documented in the synopsis.  We discussed obesity as a disease and the importance of a more detailed evaluation of all the factors contributing to the disease.  We discussed the importance of long term lifestyle changes which include  nutrition, exercise and behavioral modifications as well as the importance of customizing this to his specific health and social needs.  We discussed the benefits of reaching a healthier weight to alleviate the symptoms of existing conditions and reduce the risks of the biomechanical, metabolic and psychological effects of obesity.  Donald Hubbard appears to be in the action stage of change and states they are ready to start intensive lifestyle modifications and behavioral modifications.  30 minutes was spent today on this visit including the above counseling, pre-visit chart review, and post-visit documentation.  Reviewed by clinician on day of visit: allergies, medications, problem list, medical history, surgical history, family history, social history, and previous encounter notes pertinent to obesity diagnosis.    Seymour Bars, D.O. DABFM, DABOM [facility] [phone] [place], [place] [address] (  [phone]   

## 2022-08-22 LAB — LIPOPROTEIN A (LPA): Lipoprotein (a): 31.8 nmol/L (ref <75.0)

## 2022-08-22 LAB — HEMOGLOBIN A1C
Est. average glucose Bld gHb Est-mCnc: 123 mg/dL
Hgb A1c MFr Bld: 5.9 % — ABNORMAL HIGH (ref 4.8–5.6)

## 2022-08-22 MED ORDER — REPATHA SURECLICK 140 MG/ML ~~LOC~~ SOAJ: 140.0000 mg | SUBCUTANEOUS | 3 refills | Status: DC

## 2022-08-22 NOTE — Addendum Note (Signed)
Addended by: Lindell Spar on: 08/22/2022 10:05 AM   Modules accepted: Orders

## 2022-08-22 NOTE — Telephone Encounter (Signed)
Medication approved  08/20/2022 to 08/20/2023.

## 2022-08-22 NOTE — Assessment & Plan Note (Signed)
Blood pressure elevated today on amlodipine 2.5 mg once daily and HCTZ 12.5 mg once daily.  He reports good compliance taking his blood pressure medication.  He denies headache or chest pain.  Continue current blood pressure medication.  Look for improvements with dietary change and regular exercise.

## 2022-08-22 NOTE — Assessment & Plan Note (Signed)
Lab Results  Component Value Date   CHOL 337 (H) 08/06/2022   HDL 58 08/06/2022   LDLCALC 246 (H) 08/06/2022   TRIG 172 (H) 08/06/2022   CHOLHDL 5.8 (H) 08/06/2022   Patient has had a long history of hyperlipidemia.  He has a history of statin myopathy.  He is managed by Dr. Rennis Golden.  Notes reviewed.  He is planning to start on Repatha soon.  Continue current plan of care per cardiology.  Begin working on a heart healthy diet and weight reduction.

## 2022-09-05 ENCOUNTER — Encounter (INDEPENDENT_AMBULATORY_CARE_PROVIDER_SITE_OTHER): Payer: Self-pay | Admitting: Family Medicine

## 2022-09-05 ENCOUNTER — Other Ambulatory Visit (INDEPENDENT_AMBULATORY_CARE_PROVIDER_SITE_OTHER): Payer: Self-pay | Admitting: Family Medicine

## 2022-09-05 ENCOUNTER — Ambulatory Visit (INDEPENDENT_AMBULATORY_CARE_PROVIDER_SITE_OTHER): Payer: Commercial Managed Care - PPO | Admitting: Family Medicine

## 2022-09-05 VITALS — BP 135/90 | HR 81 | Temp 98.3°F | Ht 65.0 in | Wt 184.0 lb

## 2022-09-05 DIAGNOSIS — M549 Dorsalgia, unspecified: Secondary | ICD-10-CM

## 2022-09-05 DIAGNOSIS — R5383 Other fatigue: Secondary | ICD-10-CM | POA: Diagnosis not present

## 2022-09-05 DIAGNOSIS — E669 Obesity, unspecified: Secondary | ICD-10-CM

## 2022-09-05 DIAGNOSIS — E7849 Other hyperlipidemia: Secondary | ICD-10-CM

## 2022-09-05 DIAGNOSIS — R7303 Prediabetes: Secondary | ICD-10-CM

## 2022-09-05 DIAGNOSIS — F32A Depression, unspecified: Secondary | ICD-10-CM | POA: Diagnosis not present

## 2022-09-05 DIAGNOSIS — R0602 Shortness of breath: Secondary | ICD-10-CM | POA: Diagnosis not present

## 2022-09-05 DIAGNOSIS — I10 Essential (primary) hypertension: Secondary | ICD-10-CM

## 2022-09-05 DIAGNOSIS — F909 Attention-deficit hyperactivity disorder, unspecified type: Secondary | ICD-10-CM

## 2022-09-05 DIAGNOSIS — Z6830 Body mass index (BMI) 30.0-30.9, adult: Secondary | ICD-10-CM

## 2022-09-05 DIAGNOSIS — Z1331 Encounter for screening for depression: Secondary | ICD-10-CM

## 2022-09-05 DIAGNOSIS — G8929 Other chronic pain: Secondary | ICD-10-CM

## 2022-09-05 MED ORDER — ZEPBOUND 2.5 MG/0.5ML ~~LOC~~ SOAJ
2.5000 mg | SUBCUTANEOUS | 0 refills | Status: DC
Start: 2022-09-05 — End: 2022-10-03

## 2022-09-06 ENCOUNTER — Encounter (INDEPENDENT_AMBULATORY_CARE_PROVIDER_SITE_OTHER): Payer: Self-pay | Admitting: Internal Medicine

## 2022-09-06 LAB — COMPREHENSIVE METABOLIC PANEL
ALT: 58 IU/L — ABNORMAL HIGH (ref 0–44)
AST: 39 IU/L (ref 0–40)
Albumin/Globulin Ratio: 2.2 (ref 1.2–2.2)
Albumin: 4.4 g/dL (ref 3.9–4.9)
Alkaline Phosphatase: 73 IU/L (ref 44–121)
BUN/Creatinine Ratio: 14 (ref 10–24)
BUN: 18 mg/dL (ref 8–27)
Bilirubin Total: 0.6 mg/dL (ref 0.0–1.2)
CO2: 27 mmol/L (ref 20–29)
Calcium: 9.5 mg/dL (ref 8.6–10.2)
Chloride: 98 mmol/L (ref 96–106)
Creatinine, Ser: 1.27 mg/dL (ref 0.76–1.27)
Globulin, Total: 2 g/dL (ref 1.5–4.5)
Glucose: 120 mg/dL — ABNORMAL HIGH (ref 70–99)
Potassium: 4.2 mmol/L (ref 3.5–5.2)
Sodium: 139 mmol/L (ref 134–144)
Total Protein: 6.4 g/dL (ref 6.0–8.5)
eGFR: 63 mL/min/{1.73_m2} (ref >59)

## 2022-09-06 LAB — CBC
Hematocrit: 51.5 % — ABNORMAL HIGH (ref 37.5–51.0)
Hemoglobin: 17.2 g/dL (ref 13.0–17.7)
MCH: 29.2 pg (ref 26.6–33.0)
MCHC: 33.4 g/dL (ref 31.5–35.7)
MCV: 87 fL (ref 79–97)
Platelets: 192 10*3/uL (ref 150–450)
RBC: 5.9 x10E6/uL — ABNORMAL HIGH (ref 4.14–5.80)
RDW: 12.9 % (ref 11.6–15.4)
WBC: 5.2 10*3/uL (ref 3.4–10.8)

## 2022-09-06 LAB — TSH RFX ON ABNORMAL TO FREE T4: TSH: 1.08 u[IU]/mL (ref 0.450–4.500)

## 2022-09-06 LAB — VITAMIN D 25 HYDROXY (VIT D DEFICIENCY, FRACTURES): Vit D, 25-Hydroxy: 85.2 ng/mL (ref 30.0–100.0)

## 2022-09-06 LAB — VITAMIN B12: Vitamin B-12: 268 pg/mL (ref 232–1245)

## 2022-09-06 LAB — INSULIN, RANDOM: INSULIN: 13.5 u[IU]/mL (ref 2.6–24.9)

## 2022-09-06 NOTE — Progress Notes (Signed)
Chief Complaint:   OBESITY Donald Hubbard (MR# [id number]) is a 62 y.o. male who presents for evaluation and treatment of obesity and related comorbidities. Current BMI is Body mass index is 30.62 kg/m. Donald Hubbard has been struggling with his weight for many years and has been unsuccessful in either losing weight, maintaining weight loss, or reaching his healthy weight goal.  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  He lives with his wife Donald Hubbard.  He walks and lifts weights 3 times per week.  He would like to move 30-40 LBS.  He snacks on salty foods.  Donald Hubbard is currently in the action stage of change and ready to dedicate time achieving and maintaining a healthier weight. Donald Hubbard is interested in becoming our patient and working on intensive lifestyle modifications including (but not limited to) diet and exercise for weight loss.  Donald Hubbard's habits were reviewed today and are as follows: His family eats meals together, he thinks his family will eat healthier with him, his desired weight loss is 39 lbs, he started gaining weight 15 years ago, his heaviest weight ever was 190 pounds, he has significant food cravings issues, he snacks frequently in the evenings, he skips meals frequently, he is frequently drinking liquids with calories, and he frequently eats larger portions than normal.  Depression Screen Donald Hubbard's Food and Mood (modified PHQ-9) score was 13.     09/05/2022    8:27 AM  Depression screen PHQ 2/9  Decreased Interest 2  Down, Depressed, Hopeless 2  PHQ - 2 Score 4  Altered sleeping 3  Tired, decreased energy 2  Change in appetite 2  Feeling bad or failure about yourself  1  Trouble concentrating 0  Moving slowly or fidgety/restless 1  Suicidal thoughts 0  PHQ-9 Score 13  Difficult doing work/chores Not difficult at all   Subjective:   1. Other fatigue Donald Hubbard admits to daytime somnolence and admits to waking up still tired. Patient has a history of symptoms of  daytime fatigue, morning fatigue, and morning headache. Donald Hubbard generally gets 3 or 5.5 hours of sleep per night, and states that he has nightime awakenings. Snoring is present. Apneic episodes are present. Epworth Sleepiness Score is 4.  EKG reviewed from 08/13/2022 with Dr. Herbie Baltimore.  2. SOBOE (shortness of breath on exertion) Donald Hubbard notes increasing shortness of breath with exercising and seems to be worsening over time with weight gain. He notes getting out of breath sooner with activity than he used to. This has not gotten worse recently. Donald Hubbard denies shortness of breath at rest or orthopnea.  3. Other hyperlipidemia Patient recently started Repatha 140 mg every 14 days.  Patient has a history of statin intolerance.  He denies a problem.  4. Essential hypertension DBP 90 today.  Patient is taking HCTZ 12.5 mg daily, amlodipine 2.5 mg daily as needed.  Patient monitors blood pressures at home.  Patient has a history of ACE inhibitor side effects.  5. Adult ADHD Patient is on Adderall XR 15 mg daily.  6. Chronic back pain, unspecified back location, unspecified back pain laterality Patient has neck arthritis and sciatic pain.  Patient uses hydrocodone half a tablet as needed.  Pain limits movement.  Patient is not not stretch on a regular basis.  7. Prediabetes Patient last A1c 5.9 on 08/20/2022  Assessment/Plan:   1. Other fatigue Donald Hubbard does feel that his weight is causing his energy to be lower than it should be. Fatigue may be related  to obesity, depression or many other causes. Labs will be ordered, and in the meanwhile, Donald Hubbard will focus on self care including making healthy food choices, increasing physical activity and focusing on stress reduction.  Update labs today.  - VITAMIN D 25 Hydroxy (Vit-D Deficiency, Fractures) - Comprehensive metabolic panel - Vitamin B12 - CBC - TSH Rfx on Abnormal to Free T4 - Insulin, random  2. SOBOE (shortness of breath on exertion) Donald Hubbard does  feel that he gets out of breath more easily that he used to when he exercises. Donald Hubbard's shortness of breath appears to be obesity related and exercise induced. He has agreed to work on weight loss and gradually increase exercise to treat his exercise induced shortness of breath. Will continue to monitor closely.  3. Other hyperlipidemia Continue Repatha per cardiology.  Begin low saturated fat diet.  4. Essential hypertension Continue blood pressure medication as tolerated.  Update labs today. - Comprehensive metabolic panel  5. Adult ADHD Avoid stimulant antiobesity medications.  6. Chronic back pain, unspecified back location, unspecified back pain laterality Begin some yoga or Pilates.  7. Prediabetes Begin reducing intake of sugar and starches.  Plan to increase exercise time.  Check labs today.  - Insulin, random  8. Depression screening Donald Hubbard had a positive depression screening. Depression is commonly associated with obesity and often results in emotional eating behaviors. We will monitor this closely and work on CBT to help improve the non-hunger eating patterns. Referral to Psychology may be required if no improvement is seen as he continues in our clinic.  9. BMI 30.0-30.9,adult - tirzepatide (ZEPBOUND) 2.5 MG/0.5ML Pen; Inject 2.5 mg into the skin once a week.  Dispense: 2 mL; Refill: 0  10. Generalized obesity Begin Zepbound 2.5 mg once weekly with no refills. Patient denies a personal or family history of pancreatitis, medullary thyroid carcinoma or multiple endocrine neoplasia type II. Recommend reviewing pen training video online.   Start- tirzepatide (ZEPBOUND) 2.5 MG/0.5ML Pen; Inject 2.5 mg into the skin once a week.  Dispense: 2 mL; Refill: 0  Donald Hubbard is currently in the action stage of change and his goal is to continue with weight loss efforts. I recommend Donald Hubbard begin the structured treatment plan as follows:  He has agreed to the Category 2 Plan.  Healthy snack  list for work was given. Can substitute a protein shake Mon-Fri for breakfast.   Exercise goals: All adults should avoid inactivity. Some physical activity is better than none, and adults who participate in any amount of physical activity gain some health benefits.   Behavioral modification strategies: increasing lean protein intake, increasing vegetables, increasing water intake, decreasing alcohol intake, decreasing eating out, no skipping meals, meal planning and cooking strategies, keeping healthy foods in the home, better snacking choices, avoiding temptations, planning for success, and decreasing junk food.  He was informed of the importance of frequent follow-up visits to maximize his success with intensive lifestyle modifications for his multiple health conditions. He was informed we would discuss his lab results at his next visit unless there is a critical issue that needs to be addressed sooner. Donald Hubbard agreed to keep his next visit at the agreed upon time to discuss these results.  Objective:   Blood pressure (!) 135/90, pulse 81, temperature 98.3 F (36.8 C), height 5\' 5"  (1.651 m), weight 184 lb (83.5 kg), SpO2 97 %. Body mass index is 30.62 kg/m.  EKG: Normal sinus rhythm, rate 85 BPM.  Indirect Calorimeter completed today shows a VO2  of 228 and a REE of 1570.  His calculated basal metabolic rate is 1610 thus his basal metabolic rate is worse than expected.  General: Cooperative, alert, well developed, in no acute distress. HEENT: Conjunctivae and lids unremarkable. Cardiovascular: Regular rhythm.  Lungs: Normal work of breathing. Neurologic: No focal deficits.   Lab Results  Component Value Date   CREATININE 1.27 09/05/2022   BUN 18 09/05/2022   NA 139 09/05/2022   K 4.2 09/05/2022   CL 98 09/05/2022   CO2 27 09/05/2022   Lab Results  Component Value Date   ALT 58 (H) 09/05/2022   AST 39 09/05/2022   ALKPHOS 73 09/05/2022   BILITOT 0.6 09/05/2022   Lab Results   Component Value Date   HGBA1C 5.9 (H) 08/20/2022   Lab Results  Component Value Date   INSULIN 13.5 09/05/2022   Lab Results  Component Value Date   TSH 1.080 09/05/2022   Lab Results  Component Value Date   CHOL 337 (H) 08/06/2022   HDL 58 08/06/2022   LDLCALC 246 (H) 08/06/2022   TRIG 172 (H) 08/06/2022   CHOLHDL 5.8 (H) 08/06/2022   Lab Results  Component Value Date   WBC 5.2 09/05/2022   HGB 17.2 09/05/2022   HCT 51.5 (H) 09/05/2022   MCV 87 09/05/2022   PLT 192 09/05/2022   No results found for: "IRON", "TIBC", "FERRITIN"  Attestation Statements:   Reviewed by clinician on day of visit: allergies, medications, problem list, medical history, surgical history, family history, social history, and previous encounter notes.  I have personally spent 40 minutes total time today in preparation, patient care, nutritional counseling and documentation for this visit, including the following: review of clinical lab tests; review of medical tests/procedures/services.    I, Malcolm Metro, am acting as Energy manager for Seymour Bars, DO.  I have reviewed the above documentation for accuracy and completeness, and I agree with the above. Glennis Brink, DO

## 2022-09-08 ENCOUNTER — Encounter (INDEPENDENT_AMBULATORY_CARE_PROVIDER_SITE_OTHER): Payer: Self-pay

## 2022-09-10 NOTE — Telephone Encounter (Signed)
Zepbound.  Why your request was denied:  Your plan only covers this drug if you have been in a comprehensive weight management program for at least 6 months before starting this drug. We have denied your request because you have not been taking part in a comprehensive weight management program for at least 6 months. We reviewed the information we had. Your request has been denied. Your doctor can send [place] any new or missing information for [place] to review. For this drug, you may have to meet other criteria. You can request the drug policy for more details. You can also request other plan documents for your review. We've let your doctor know about this denial, so they will be ready to work with you on your next steps.

## 2022-09-10 NOTE — Telephone Encounter (Signed)
Prior authorization for patients Donald Hubbard is denied. Patient needs to be in a weight loss program for at least 6 months. Dr. Cathey Endow and patient have been notified.

## 2022-09-10 NOTE — Telephone Encounter (Signed)
Prior authorization done via cover my meds for patients Zepbound. Waiting on determination.   

## 2022-09-20 ENCOUNTER — Ambulatory Visit (INDEPENDENT_AMBULATORY_CARE_PROVIDER_SITE_OTHER): Payer: Commercial Managed Care - PPO | Admitting: Family Medicine

## 2022-09-20 ENCOUNTER — Encounter (INDEPENDENT_AMBULATORY_CARE_PROVIDER_SITE_OTHER): Payer: Self-pay | Admitting: Family Medicine

## 2022-09-20 VITALS — BP 131/77 | HR 85 | Temp 98.1°F | Ht 65.0 in | Wt 179.0 lb

## 2022-09-20 DIAGNOSIS — I10 Essential (primary) hypertension: Secondary | ICD-10-CM

## 2022-09-20 DIAGNOSIS — E669 Obesity, unspecified: Secondary | ICD-10-CM

## 2022-09-20 DIAGNOSIS — R7989 Other specified abnormal findings of blood chemistry: Secondary | ICD-10-CM | POA: Diagnosis not present

## 2022-09-20 DIAGNOSIS — R7303 Prediabetes: Secondary | ICD-10-CM

## 2022-09-20 DIAGNOSIS — Z6829 Body mass index (BMI) 29.0-29.9, adult: Secondary | ICD-10-CM

## 2022-09-20 NOTE — Assessment & Plan Note (Signed)
Reviewed lab from last visit B12 level low- end of normal range at 268 Reports some paresthesias and brain fog Unsure if he has a B12 supplement in his pill box  He will report back next visit about if and how much B12 he is currently taking. If not taking any vitamin B12, will add in B12 500 mcg daily

## 2022-09-20 NOTE — Assessment & Plan Note (Signed)
Lab Results  Component Value Date   HGBA1C 5.9 (H) 08/20/2022   He has started to reduce his snacks, alcohol and sweets with prescribed meal plan He has increased his walking time He has never used metformin but is seeing some benefit from Zepbound for weight management  Anticipate A1c improvements with healthy lifestyle changes + use of Zepbound

## 2022-09-20 NOTE — Assessment & Plan Note (Signed)
BP is well controlled today but he has had to STOP his HCTZ 12.5 mg each morning due to hypotensive BP reading since starting Zepbound 2.5 mg injections.  He reports Bps of 60s/40s when out in the sun surfing.  Denies feeling syncopal and was not drinking much water out in the sun.  He is now monitoring his BP readings without HCTZ and has Amlodopine 2.5 mg to take prn.  Stay Off HCTZ and monitor BP readings Hydrate well with 80 oz of water daily

## 2022-09-20 NOTE — Progress Notes (Signed)
Office: [phone]  /  Fax: [phone]  WEIGHT SUMMARY AND BIOMETRICS  Starting Date: 09/05/22  Starting Weight: 184 lb   Weight Lost Since Last Visit: 5 lb   Vitals Temp: 98.1 F (36.7 C) BP: 131/77 Pulse Rate: 85 SpO2: 99 %   Body Composition  Body Fat %: 27.6 % Fat Mass (lbs): 49.4 lbs Muscle Mass (lbs): 123.2 lbs Total Body Water (lbs): 90 lbs Visceral Fat Rating : 15     HPI  Chief Complaint: OBESITY  Donald Hubbard is here to discuss his progress with his obesity treatment plan. He is on the the Category 2 Plan and states he is following his eating plan approximately 75-80 % of the time. He states he is exercising walking 30-40 minutes 6 times per week.   Interval History:  Since last office visit he is down 5 lb He lost 1 lb of muscle mass and 3.2 lb of body fat His VFR went down 1 point to 15. He did have to hold his HCTZ due to low BP-- monitoring He started on Zepbound 2.5 mg and has had 2 injections He had some bloating, slight nausea He denies constipation He has cut back on nighttime snacks He has cut back on ETOH   Pharmacotherapy: Zepbound 2.5 mg weekly  PHYSICAL EXAM:  Blood pressure 131/77, pulse 85, temperature 98.1 F (36.7 C), height 5\' 5"  (1.651 m), weight 179 lb (81.2 kg), SpO2 99 %. Body mass index is 29.79 kg/m.  General: He is overweight, cooperative, alert, well developed, and in no acute distress. PSYCH: Has normal mood, affect and thought process.   Lungs: Normal breathing effort, no conversational dyspnea.   ASSESSMENT AND PLAN  TREATMENT PLAN FOR OBESITY:  Recommended Dietary Goals  Donald Hubbard is currently in the action stage of change. As such, his goal is to continue weight management plan. He has agreed to the Category 2 Plan.  Behavioral Intervention  We discussed the following Behavioral Modification Strategies today: increasing lean protein intake, decreasing simple carbohydrates , increasing vegetables, increasing  lower glycemic fruits, avoiding skipping meals, increasing water intake, work on meal planning and preparation, keeping healthy foods at home, continue to practice mindfulness when eating, and planning for success.  Additional resources provided today: NA  Recommended Physical Activity Goals  Donald Hubbard has been advised to work up to 150 minutes of moderate intensity aerobic activity a week and strengthening exercises 2-3 times per week for cardiovascular health, weight loss maintenance and preservation of muscle mass.   He has agreed to [name] strengthening exercises with a goal of 2-3 sessions a week   Pharmacotherapy changes for the treatment of obesity: Zepbound 2.5 mg, consider increase to 5 mg in 2 weeks if not skipping meals  ASSOCIATED CONDITIONS ADDRESSED TODAY  Essential hypertension Assessment & Plan: BP is well controlled today but he has had to STOP his HCTZ 12.5 mg each morning due to hypotensive BP reading since starting Zepbound 2.5 mg injections.  He reports Bps of 60s/40s when out in the sun surfing.  Denies feeling syncopal and was not drinking much water out in the sun.  He is now monitoring his BP readings without HCTZ and has Amlodopine 2.5 mg to take prn.  Stay Off HCTZ and monitor BP readings Hydrate well with 80 oz of water daily   Generalized obesity with starting BMI 30.6  BMI 29.0-29.9,adult  Prediabetes Assessment & Plan: Lab Results  Component Value Date   HGBA1C 5.9 (H) 08/20/2022   He has  started to reduce his snacks, alcohol and sweets with prescribed meal plan He has increased his walking time He has never used metformin but is seeing some benefit from Zepbound for weight management  Anticipate A1c improvements with healthy lifestyle changes + use of Zepbound   Low vitamin B12 level Assessment & Plan: Reviewed lab from last visit B12 level low- end of normal range at 268 Reports some paresthesias and brain fog Unsure if he has a B12 supplement  in his pill box  He will report back next visit about if and how much B12 he is currently taking. If not taking any vitamin B12, will add in B12 500 mcg daily        He was informed of the importance of frequent follow up visits to maximize his success with intensive lifestyle modifications for his multiple health conditions.   ATTESTASTION STATEMENTS:  Reviewed by clinician on day of visit: allergies, medications, problem list, medical history, surgical history, family history, social history, and previous encounter notes pertinent to obesity diagnosis.   I have personally spent 30 minutes total time today in preparation, patient care, nutritional counseling and documentation for this visit, including the following: review of clinical lab tests; review of medical tests/procedures/services.      Donald Brink, DO DABFM, DABOM [facility] [address] [place], [place] [address] [phone]

## 2022-10-02 ENCOUNTER — Encounter (INDEPENDENT_AMBULATORY_CARE_PROVIDER_SITE_OTHER): Payer: Self-pay

## 2022-10-03 ENCOUNTER — Other Ambulatory Visit (INDEPENDENT_AMBULATORY_CARE_PROVIDER_SITE_OTHER): Payer: Self-pay | Admitting: Family Medicine

## 2022-10-03 DIAGNOSIS — Z6830 Body mass index (BMI) 30.0-30.9, adult: Secondary | ICD-10-CM

## 2022-10-03 DIAGNOSIS — E669 Obesity, unspecified: Secondary | ICD-10-CM

## 2022-10-03 MED ORDER — ZEPBOUND 5 MG/0.5ML ~~LOC~~ SOAJ
5.0000 mg | SUBCUTANEOUS | 0 refills | Status: DC
Start: 2022-10-03 — End: 2022-10-09

## 2022-10-03 NOTE — Addendum Note (Signed)
Addended by: Glennis Brink on: 10/03/2022 04:19 PM   Modules accepted: Orders

## 2022-10-09 ENCOUNTER — Encounter (INDEPENDENT_AMBULATORY_CARE_PROVIDER_SITE_OTHER): Payer: Self-pay | Admitting: Family Medicine

## 2022-10-09 ENCOUNTER — Telehealth (INDEPENDENT_AMBULATORY_CARE_PROVIDER_SITE_OTHER): Payer: Self-pay | Admitting: Family Medicine

## 2022-10-09 ENCOUNTER — Ambulatory Visit (INDEPENDENT_AMBULATORY_CARE_PROVIDER_SITE_OTHER): Payer: Commercial Managed Care - PPO | Admitting: Family Medicine

## 2022-10-09 VITALS — BP 142/83 | HR 74 | Temp 98.7°F | Ht 65.0 in | Wt 173.0 lb

## 2022-10-09 DIAGNOSIS — E669 Obesity, unspecified: Secondary | ICD-10-CM | POA: Diagnosis not present

## 2022-10-09 DIAGNOSIS — R7303 Prediabetes: Secondary | ICD-10-CM

## 2022-10-09 DIAGNOSIS — I10 Essential (primary) hypertension: Secondary | ICD-10-CM | POA: Diagnosis not present

## 2022-10-09 DIAGNOSIS — Z6828 Body mass index (BMI) 28.0-28.9, adult: Secondary | ICD-10-CM

## 2022-10-09 MED ORDER — ZEPBOUND 5 MG/0.5ML ~~LOC~~ SOAJ
5.0000 mg | SUBCUTANEOUS | 0 refills | Status: DC
Start: 2022-10-09 — End: 2022-11-15

## 2022-10-09 NOTE — Assessment & Plan Note (Signed)
Blood pressure is elevated today.  He is on HCTZ 12.5 mg daily and amlodipine 2.5 mg daily.  He reports good compliance taking all of his blood pressure medication.  He denies chest pain or headache.  Continue all blood pressure medication as prescribed.  Look for improvements with further weight reduction.

## 2022-10-09 NOTE — Telephone Encounter (Signed)
Prior authorization done via cover my meds for patients Zepbound waiting on determination.

## 2022-10-09 NOTE — Progress Notes (Signed)
Office: [phone]  /  Fax: [phone]  WEIGHT SUMMARY AND BIOMETRICS  Starting Date: 09/05/22  Starting Weight: 184lb   Weight Lost Since Last Visit: 6lb   Vitals Temp: 98.7 F (37.1 C) BP: (!) 142/83 Pulse Rate: 74 SpO2: 99 %   Body Composition  Body Fat %: 26.8 % Fat Mass (lbs): 46.6 lbs Muscle Mass (lbs): 120.6 lbs Total Body Water (lbs): 87 lbs Visceral Fat Rating : 15     HPI  Chief Complaint: OBESITY  Donald Hubbard is here to discuss his progress with his obesity treatment plan. He is on the the Category 2 Plan and states he is following his eating plan approximately 90-95 % of the time. He states he is exercising 30 minutes 2-3 times per week.   Interval History:  Since last office visit he is down 6 lb He is down 12 lb from his info visit 4/30 This is a 6.4% total body weight loss since starting on Zepbound His clothes are fitting better He is on Zepbound 2.5 mg weekly and tolerating well with minimal nausea He has good control over his appetite and is not snacking as much He is getting in fruits and veggies He is not drinking much water and is down 3 lb of water He plans to start doing weight training at home and at work  He has cut back on sweets  Pharmacotherapy: Zepbound 2.5 mg --> moving to 5 mg dose today  PHYSICAL EXAM:  Blood pressure (!) 142/83, pulse 74, temperature 98.7 F (37.1 C), height 5\' 5"  (1.651 m), weight 173 lb (78.5 kg), SpO2 99 %. Body mass index is 28.79 kg/m.  General: He is healthy appearing cooperative, alert, well developed, and in no acute distress. PSYCH: Has normal mood, affect and thought process.   Lungs: Normal breathing effort, no conversational dyspnea.   ASSESSMENT AND PLAN  TREATMENT PLAN FOR OBESITY:  Recommended Dietary Goals  Donald Hubbard is currently in the action stage of change. As such, his goal is to continue weight management plan. He has agreed to the Category 2 Plan.  Behavioral Intervention  We  discussed the following Behavioral Modification Strategies today: increasing lean protein intake, decreasing simple carbohydrates , increasing vegetables, increasing lower glycemic fruits, increasing water intake, keeping healthy foods at home, avoiding temptations and identifying enticing environmental cues, continue to practice mindfulness when eating, and planning for success.  Additional resources provided today: NA  Recommended Physical Activity Goals  Donald Hubbard has been advised to work up to 150 minutes of moderate intensity aerobic activity a week and strengthening exercises 2-3 times per week for cardiovascular health, weight loss maintenance and preservation of muscle mass.   He has agreed to [name] strengthening exercises with a goal of 2-3 sessions a week  and Increase the intensity, frequency or duration of aerobic exercises    Pharmacotherapy changes for the treatment of obesity: Zepbound increased to 5 mg weekly injection  ASSOCIATED CONDITIONS ADDRESSED TODAY  Essential hypertension Assessment & Plan: Blood pressure is elevated today.  He is on HCTZ 12.5 mg daily and amlodipine 2.5 mg daily.  He reports good compliance taking all of his blood pressure medication.  He denies chest pain or headache.  Continue all blood pressure medication as prescribed.  Look for improvements with further weight reduction.   Generalized obesity with starting BMI 30.6 -     Zepbound; Inject 5 mg into the skin once a week.  Dispense: 2 mL; Refill: 0  BMI 28.0-28.9,adult  Prediabetes  Assessment & Plan: Lab Results  Component Value Date   HGBA1C 5.9 (H) 08/20/2022   He is doing well reducing his intake of added sugar and refined carbohydrates.  He has lost 12 pounds in the past 6 weeks.  Continue to work on prescribed meal plan, adding in consistent exercise with a goal of 30 to 40 minutes 4 to 5 days a week. Repeat A1c in 3 months       He was informed of the importance of frequent  follow up visits to maximize his success with intensive lifestyle modifications for his multiple health conditions.   ATTESTASTION STATEMENTS:  Reviewed by clinician on day of visit: allergies, medications, problem list, medical history, surgical history, family history, social history, and previous encounter notes pertinent to obesity diagnosis.   I have personally spent 30 minutes total time today in preparation, patient care, nutritional counseling and documentation for this visit, including the following: review of clinical lab tests; review of medical tests/procedures/services.      Glennis Brink, DO DABFM, DABOM [facility] [address] [place], [place] [address] [phone]

## 2022-10-09 NOTE — Assessment & Plan Note (Signed)
Lab Results  Component Value Date   HGBA1C 5.9 (H) 08/20/2022   He is doing well reducing his intake of added sugar and refined carbohydrates.  He has lost 12 pounds in the past 6 weeks.  Continue to work on prescribed meal plan, adding in consistent exercise with a goal of 30 to 40 minutes 4 to 5 days a week. Repeat A1c in 3 months

## 2022-10-10 NOTE — Telephone Encounter (Signed)
Prior authorization denied for patients Zepbound. Patient notified.

## 2022-10-15 ENCOUNTER — Encounter (INDEPENDENT_AMBULATORY_CARE_PROVIDER_SITE_OTHER): Payer: Self-pay

## 2022-10-23 DIAGNOSIS — E785 Hyperlipidemia, unspecified: Secondary | ICD-10-CM

## 2022-11-08 LAB — HEMOGLOBIN A1C
Est. average glucose Bld gHb Est-mCnc: 120 mg/dL
Hgb A1c MFr Bld: 5.8 % — ABNORMAL HIGH (ref 4.8–5.6)

## 2022-11-09 ENCOUNTER — Ambulatory Visit: Payer: Commercial Managed Care - PPO | Attending: Internal Medicine | Admitting: Internal Medicine

## 2022-11-09 ENCOUNTER — Encounter: Payer: Self-pay | Admitting: Internal Medicine

## 2022-11-09 VITALS — BP 114/84 | HR 98 | Ht 66.0 in | Wt 168.2 lb

## 2022-11-09 DIAGNOSIS — M791 Myalgia, unspecified site: Secondary | ICD-10-CM

## 2022-11-09 DIAGNOSIS — E7849 Other hyperlipidemia: Secondary | ICD-10-CM

## 2022-11-09 DIAGNOSIS — T466X5D Adverse effect of antihyperlipidemic and antiarteriosclerotic drugs, subsequent encounter: Secondary | ICD-10-CM

## 2022-11-09 DIAGNOSIS — E785 Hyperlipidemia, unspecified: Secondary | ICD-10-CM

## 2022-11-09 NOTE — Patient Instructions (Signed)
Medication Instructions:  NO CHANGES  *If you need a refill on your cardiac medications before your next appointment, please call your pharmacy*   Lab Work: NMR Lipoprofile if no results by Monday 11/12/22  [facility] locations:  [facility] - [address] ([facility] [facility] office) - [address] ([facility]) - [address] [phone]   Follow-Up: At [facility], you and your health needs are our priority.  As part of our continuing mission to provide you with exceptional heart care, we have created designated Provider Care Teams.  These Care Teams include your primary Cardiologist (physician) and Advanced Practice Providers (APPs -  Physician Assistants and Nurse Practitioners) who all work together to provide you with the care you need, when you need it.  We recommend signing up for the patient portal called "MyChart".  Sign up information is provided on this After Visit Summary.  MyChart is used to connect with patients for Virtual Visits (Telemedicine).  Patients are able to view lab/test results, encounter notes, upcoming appointments, etc.  Non-urgent messages can be sent to your provider as well.   To learn more about what you can do with MyChart, go to [email or web address].    Your next appointment:    6 months with Dr. Rennis Golden  ** call in Sept/Oct for a Jan 2025 apointment

## 2022-11-09 NOTE — Progress Notes (Signed)
LIPID CLINIC CONSULT NOTE  Chief Complaint:  Manage dyslipidemia  Primary Care Physician: Joycelyn Rua, MD  Primary Cardiologist:  Bryan Lemma, MD  HPI:  Donald Hubbard is a 62 y.o. male who is being seen today for the evaluation of dyslipidemia at the request of Joycelyn Rua, MD. this is a pleasant 62 year old male kindly referred for evaluation management of dyslipidemia.  Unfortunately he has a strong family history of high cholesterol and heart disease.  His mother has had a stent and has high cholesterol.  His father had a four-vessel bypass.  His paternal grandmother also had coronary artery disease.  He has tried statins in the past but has been intolerant of both pravastatin and rosuvastatin as well as ezetimibe.  Most recently his lipids were significantly elevated with total cholesterol 337, HDL 58, triglycerides 172 and LDL of 246.  This is highly suggestive of a familial hyperlipidemia.  11/09/2022  Donald Hubbard returns today for follow-up.  He had recent blood work 2 days ago however his lipid profile is not yet resulted.  He has been on the Repatha.  No issues with it.  He did have a repeat A1c which came down 0.1%.  He has actually lost some weight on Zepbound.  PMHx:  Past Medical History:  Diagnosis Date   Adult ADHD    Back pain    Back pain    Chronic pain    GERD (gastroesophageal reflux disease)    Heart burn    High cholesterol    Hip pain    History of viral pericarditis 2000   virus caused swelling and fluid to build up around the heart   Hypertension    no meds   Joint pain    Osteoarthritis    Overweight    Prediabetes    Sciatic nerve pain    SOBOE (shortness of breath on exertion)     Past Surgical History:  Procedure Laterality Date   BREAST BIOPSY Left 12/03/2019   Procedure: LEFT EXCISIONAL BREAST BIOPSY;  Surgeon: Manus Rudd, MD;  Location: [facility];  Service: General;  Laterality: Left;  LMA, PEC BLOCK    BREAST LUMPECTOMY Left 2020   male   COLONOSCOPY  2019   large polyps   NASAL SINUS SURGERY  2018   Periodontal surgery     ROTATOR CUFF REPAIR  05/28/2017   Dr. Rennis Chris   SURGERY SCROTAL / TESTICULAR  1967   UVULECTOMY     WISDOM TOOTH EXTRACTION  1981    FAMHx:  Family History  Problem Relation Age of Onset   Cancer Mother    Stroke Mother    Hyperlipidemia Mother    Hypertension Mother    Coronary artery disease Mother 49       Was a long-term smoker   Heart disease Mother    Obesity Mother    Hypertension Father    Hyperlipidemia Father    Coronary artery disease Father 74       At CABG at age 39    SOCHx:   reports that he has never smoked. He has never used smokeless tobacco. He reports current alcohol use. He reports that he does not use drugs.  ALLERGIES:  Allergies  Allergen Reactions   Atenolol     Other reaction(s): bradycardia Other reaction(s): bradycardia   Doxazosin Mesylate     Other reaction(s): hypotension Other reaction(s): hypotension   Lisinopril     Other reaction(s): itching Other reaction(s):  itching   Pravastatin     Other reaction(s): Unknown Other reaction(s): muscle aches   Rosuvastatin     Other reaction(s): myalgias Other reaction(s): myalgias   Zetia [Ezetimibe]     Myalgias - hans ache     ROS: Pertinent items noted in HPI and remainder of comprehensive ROS otherwise negative.  HOME MEDS: Current Outpatient Medications on File Prior to Visit  Medication Sig Dispense Refill   acyclovir (ZOVIRAX) 800 MG tablet Take 1 tablet by mouth 2 (two) times daily as needed.     albuterol (VENTOLIN HFA) 108 (90 Base) MCG/ACT inhaler Inhale into the lungs every 6 (six) hours as needed for wheezing or shortness of breath.     amLODipine (NORVASC) 2.5 MG tablet Take 2.5 mg  tablet in the afternoon if blood pressure is above 140 systolic , (Patient taking differently: Take 2.5 mg by mouth as needed. Take 2.5 mg  tablet in the afternoon if  blood pressure is above 140 systolic ,) 270 tablet 2   amphetamine-dextroamphetamine (ADDERALL XR) 15 MG 24 hr capsule Take 15 mg by mouth every morning.     cetirizine (ZYRTEC) 10 MG tablet Take 1 tablet by mouth as needed.     Cholecalciferol (VITAMIN D-3) 125 MCG (5000 UT) TABS Take 1 tablet by mouth every other day.     DiazePAM (VALIUM PO) Take 5 mg by mouth as needed.     Evolocumab (REPATHA SURECLICK) 140 MG/ML SOAJ Inject 140 mg into the skin every 14 (fourteen) days. 6 mL 3   Fexofenadine HCl (MUCINEX ALLERGY PO) Take 1,200 mcg by mouth in the morning.     fluticasone (FLONASE) 50 MCG/ACT nasal spray Place 1 spray into both nostrils as needed.     GNP MELATONIN PO Take 10 mg by mouth.     HYDROcodone-acetaminophen (NORCO/VICODIN) 5-325 MG tablet Take 1 tablet by mouth every 6 (six) hours as needed for moderate pain.     ipratropium (ATROVENT) 0.06 % nasal spray Place 1 spray into both nostrils daily.     lansoprazole (PREVACID) 30 MG capsule Take 30 mg by mouth daily.     levocetirizine (XYZAL) 5 MG tablet Take 5 mg by mouth every evening.     MAGNESIUM GLYCINATE PO Take 350 mg by mouth at bedtime.     Menatetrenone (VITAMIN K2) 100 MCG TABS Take 100 mg by mouth daily.     PSYLLIUM PO Take by mouth.     tirzepatide (ZEPBOUND) 5 MG/0.5ML Pen Inject 5 mg into the skin once a week. 2 mL 0   hydrochlorothiazide (MICROZIDE) 12.5 MG capsule TAKE 1 CAPSULE BY MOUTH EVERY DAY (Patient not taking: Reported on 11/09/2022) 90 capsule 1   No current facility-administered medications on file prior to visit.    LABS/IMAGING: No results found for this or any previous visit (from the past 48 hour(s)). No results found.  LIPID PANEL:    Component Value Date/Time   CHOL 337 (H) 08/06/2022 0954   TRIG 172 (H) 08/06/2022 0954   HDL 58 08/06/2022 0954   CHOLHDL 5.8 (H) 08/06/2022 0954   LDLCALC 246 (H) 08/06/2022 0954    WEIGHTS: Wt Readings from Last 3 Encounters:  11/09/22 168 lb 3.2 oz  (76.3 kg)  10/09/22 173 lb (78.5 kg)  09/20/22 179 lb (81.2 kg)    VITALS: BP 114/84   Pulse 98   Ht 5\' 6"  (1.676 m)   Wt 168 lb 3.2 oz (76.3 kg)   SpO2 98%  BMI 27.15 kg/m   EXAM: Deferred  EKG: Deferred  ASSESSMENT: Probable familial hyperlipidemia, LDL greater than 190, based on Simon-Broome criteria Multiple family members with high cholesterol and cardiovascular disease in both parents Statin and ezetimibe intolerant-myalgias Zero CAC score (2023)  PLAN: 1.   Donald Hubbard has had some recent weight loss with Zepbound.  His A1c is slightly lower at 5.8%.  A repeat lipid apparently was drawn a couple days ago but has not yet pending.  Will monitor to see if that results over the weekend, otherwise plan to repeat lipid next week.  Follow-up with me in 6 months or sooner as necessary.  Chrystie Nose, MD, [facility], FACP  [facility]  [facility]  Medical Director of the [facility]  Cardiovascular Risk Reduction Clinic Diplomate of the American Board of Clinical Lipidology Attending Cardiologist  Direct Dial: [phone]  Fax: [phone]  Website:  [email or web address] 11/09/2022, 4:19 PM

## 2022-11-15 ENCOUNTER — Encounter (INDEPENDENT_AMBULATORY_CARE_PROVIDER_SITE_OTHER): Payer: Self-pay | Admitting: Family Medicine

## 2022-11-15 ENCOUNTER — Ambulatory Visit (INDEPENDENT_AMBULATORY_CARE_PROVIDER_SITE_OTHER): Payer: Commercial Managed Care - PPO | Admitting: Family Medicine

## 2022-11-15 VITALS — BP 104/73 | HR 92 | Temp 98.6°F | Ht 66.0 in | Wt 166.0 lb

## 2022-11-15 DIAGNOSIS — Z6826 Body mass index (BMI) 26.0-26.9, adult: Secondary | ICD-10-CM | POA: Diagnosis not present

## 2022-11-15 DIAGNOSIS — R7303 Prediabetes: Secondary | ICD-10-CM | POA: Diagnosis not present

## 2022-11-15 DIAGNOSIS — E669 Obesity, unspecified: Secondary | ICD-10-CM | POA: Diagnosis not present

## 2022-11-15 DIAGNOSIS — I10 Essential (primary) hypertension: Secondary | ICD-10-CM

## 2022-11-15 MED ORDER — ZEPBOUND 7.5 MG/0.5ML ~~LOC~~ SOAJ
7.5000 mg | SUBCUTANEOUS | 0 refills | Status: DC
Start: 2022-11-15 — End: 2022-12-17

## 2022-11-15 NOTE — Progress Notes (Signed)
Office: [phone]  /  Fax: [phone]  WEIGHT SUMMARY AND BIOMETRICS  No data recorded  No data recorded   No data recorded   No data recorded  No data recorded    HPI  Chief Complaint: OBESITY  Donald Hubbard is here to discuss his progress with his obesity treatment plan. He is on the the Category 2 Plan and states he is following his eating plan approximately 80-85 % of the time. He states he is exercising 30 minutes 3 times per week.   Interval History:  Since last office visit he is down 7 lb in the past month He has a net weight loss of 19 lb in the past  He has stopped hydrochlorothiazide and only takes Amlo prn  He has been on vacation and has been maintaining muscle mass Taking Zepbdound 5 mg weekly  Occasional nausea - on ginger pill  Pharmacotherapy: Zepbound 5 mg weekly  PHYSICAL EXAM:  Blood pressure 104/73, pulse 92, temperature 98.6 F (37 C), height 5\' 6"  (1.676 m), weight 166 lb (75.3 kg), SpO2 99%. Body mass index is 26.79 kg/m.  General: He is overweight, cooperative, alert, well developed, and in no acute distress. PSYCH: Has normal mood, affect and thought process.   Lungs: Normal breathing effort, no conversational dyspnea.   ASSESSMENT AND PLAN  TREATMENT PLAN FOR OBESITY:  Recommended Dietary Goals  Donald Hubbard is currently in the action stage of change. As such, his goal is to continue weight management plan. He has agreed to the Category 2 Plan.  Behavioral Intervention  We discussed the following Behavioral Modification Strategies today: increasing lean protein intake, decreasing simple carbohydrates , increasing vegetables, increasing lower glycemic fruits, increasing fiber rich foods, increasing water intake, keeping healthy foods at home, continue to practice mindfulness when eating, and planning for success.  Additional resources provided today: NA  Recommended Physical Activity Goals  Donald Hubbard has been advised to work up to 150  minutes of moderate intensity aerobic activity a week and strengthening exercises 2-3 times per week for cardiovascular health, weight loss maintenance and preservation of muscle mass.   He has agreed to [name] strengthening exercises with a goal of 2-3 sessions a week   Pharmacotherapy changes for the treatment of obesity: increase Zebpound to 7.5 mg weekly  ASSOCIATED CONDITIONS ADDRESSED TODAY  Essential hypertension Assessment & Plan: BP is well controlled on amlodopine 2.5 mg -- he has been able to reduce intake to prn-- checking home BP readings  Denies CP or palpitations  Continue to check Bps off of almodpine   Generalized obesity with starting BMI 30.6 -     Zepbound; Inject 7.5 mg into the skin once a week.  Dispense: 2 mL; Refill: 0  BMI 26.0-26.9,adult  Prediabetes Assessment & Plan: Lab Results  Component Value Date   HGBA1C 5.8 (H) 11/07/2022   Reviewed labs from last visit A1c is improving weight weight reduction, dietary changes, use of Zepbound and adding more consistent exercise  Continue to look for improvement in prediabetes with weight loss       He was informed of the importance of frequent follow up visits to maximize his success with intensive lifestyle modifications for his multiple health conditions.   ATTESTASTION STATEMENTS:  Reviewed by clinician on day of visit: allergies, medications, problem list, medical history, surgical history, family history, social history, and previous encounter notes pertinent to obesity diagnosis.   I have personally spent 30 minutes total time today in preparation, patient care, nutritional counseling  and documentation for this visit, including the following: review of clinical lab tests; review of medical tests/procedures/services.      Glennis Brink, DO DABFM, DABOM [facility] [address] [place], [place] [address] [phone]

## 2022-11-18 NOTE — Assessment & Plan Note (Signed)
Lab Results  Component Value Date   HGBA1C 5.8 (H) 11/07/2022   Reviewed labs from last visit A1c is improving weight weight reduction, dietary changes, use of Zepbound and adding more consistent exercise  Continue to look for improvement in prediabetes with weight loss

## 2022-11-18 NOTE — Assessment & Plan Note (Signed)
BP is well controlled on amlodopine 2.5 mg -- he has been able to reduce intake to prn-- checking home BP readings  Denies CP or palpitations  Continue to check Bps off of almodpine

## 2022-12-17 ENCOUNTER — Encounter: Payer: Self-pay | Admitting: Family Medicine

## 2022-12-17 ENCOUNTER — Ambulatory Visit: Payer: Commercial Managed Care - PPO | Admitting: Family Medicine

## 2022-12-17 VITALS — BP 136/92 | HR 99 | Temp 98.0°F | Ht 66.0 in | Wt 164.0 lb

## 2022-12-17 DIAGNOSIS — E669 Obesity, unspecified: Secondary | ICD-10-CM | POA: Diagnosis not present

## 2022-12-17 DIAGNOSIS — Z6826 Body mass index (BMI) 26.0-26.9, adult: Secondary | ICD-10-CM

## 2022-12-17 DIAGNOSIS — R7303 Prediabetes: Secondary | ICD-10-CM | POA: Diagnosis not present

## 2022-12-17 DIAGNOSIS — I10 Essential (primary) hypertension: Secondary | ICD-10-CM

## 2022-12-17 DIAGNOSIS — Z0289 Encounter for other administrative examinations: Secondary | ICD-10-CM

## 2022-12-17 MED ORDER — ZEPBOUND 7.5 MG/0.5ML ~~LOC~~ SOAJ: 7.5000 mg | SUBCUTANEOUS | 0 refills | Status: DC

## 2022-12-17 NOTE — Assessment & Plan Note (Signed)
Diastolic blood pressure slightly elevated today.  He has been able to discontinue his amlodipine 2.5 mg daily.  His blood pressure was running low previously.  He has been monitoring his blood pressures at home.  He denies chest pain or headache.  Continue to work on healthy eating, regular exercise and maintaining a healthy weight.  Follow-up with cardiology or PCP for blood pressure management.

## 2022-12-17 NOTE — Progress Notes (Signed)
Office: [phone]  /  Fax: [phone]  WEIGHT SUMMARY AND BIOMETRICS  Starting Date: 09/05/22  Starting Weight: 184lb   Weight Lost Since Last Visit: 2lb   Vitals Temp: 98 F (36.7 C) BP: (!) 136/92 Pulse Rate: 99 SpO2: 97 %     HPI  Chief Complaint: OBESITY  Donald Hubbard is here to discuss his progress with his obesity treatment plan. He is on the the Category 2 Plan and states he is following his eating plan approximately 90 % of the time. He states he is exercising 30 minutes 3-4 times per week.   Interval History:  Since last office visit he is down 2 lb He did increase Zepbound to 7.5 mg 2 weeks ago He has a lot of satiety and is able to get in a protein bar and a small dinner for the first 3 days following his injection He has felt hungrier lately with some snacking  He has cut back on ETOH intake He is getting in some fruits and veggies He has been using free weights and body weight training 3-4 a week He has a net weight loss of 21 lb in the past 4 mos  This is an 11.3% total body weight loss  Pharmacotherapy: Zepbound 7.5 mg once weekly injection  PHYSICAL EXAM:  Blood pressure (!) 136/92, pulse 99, temperature 98 F (36.7 C), height 5\' 6"  (1.676 m), weight 164 lb (74.4 kg), SpO2 97%. Body mass index is 26.47 kg/m.  General: He is overweight, cooperative, alert, well developed, and in no acute distress. PSYCH: Has normal mood, affect and thought process.   Lungs: Normal breathing effort, no conversational dyspnea.   ASSESSMENT AND PLAN  TREATMENT PLAN FOR OBESITY:  Recommended Dietary Goals  Donald Hubbard is currently in the action stage of change. As such, his goal is to continue weight management plan. He has agreed to keeping a food journal and adhering to recommended goals of 1600 calories and 85 g of protein.  Behavioral Intervention  We discussed the following Behavioral Modification Strategies today: increasing lean protein intake, decreasing  simple carbohydrates , increasing vegetables, increasing lower glycemic fruits, increasing fiber rich foods, avoiding skipping meals, increasing water intake, keeping healthy foods at home, continue to practice mindfulness when eating, planning for success, and better snacking choices. -Avoid skipping meals -Reviewed easy lunch options combining a lean protein and a fresh fruit -Focus on meals, reducing excess snacks  Additional resources provided today: NA  Recommended Physical Activity Goals  Donald Hubbard has been advised to work up to 150 minutes of moderate intensity aerobic activity a week and strengthening exercises 2-3 times per week for cardiovascular health, weight loss maintenance and preservation of muscle mass.   He has agreed to Increase the intensity, frequency or duration of strengthening exercises  -Recommend 30 minutes of walking daily and strength training 2 days a week  Pharmacotherapy changes for the treatment of obesity: None  ASSOCIATED CONDITIONS ADDRESSED TODAY  Essential hypertension Assessment & Plan: Diastolic blood pressure slightly elevated today.  He has been able to discontinue his amlodipine 2.5 mg daily.  His blood pressure was running low previously.  He has been monitoring his blood pressures at home.  He denies chest pain or headache.  Continue to work on healthy eating, regular exercise and maintaining a healthy weight.  Follow-up with cardiology or PCP for blood pressure management.   Generalized obesity with starting BMI 30.6 -     Zepbound; Inject 7.5 mg into the skin once a  week.  Dispense: 2 mL; Refill: 0  BMI 26.0-26.9,adult  Prediabetes Assessment & Plan: Lab Results  Component Value Date   HGBA1C 5.8 (H) 11/07/2022   He has been working on reducing his intake of added sugar, starches and increasing regular exercise.  He has seen improvements in A1c with a drop of 5.9-5.8.  Plan to recheck A1c in the next 4 months.  Continue working on a low  sugar/low starch diet.       He was informed of the importance of frequent follow up visits to maximize his success with intensive lifestyle modifications for his multiple health conditions.   ATTESTASTION STATEMENTS:  Reviewed by clinician on day of visit: allergies, medications, problem list, medical history, surgical history, family history, social history, and previous encounter notes pertinent to obesity diagnosis.   I have personally spent 30 minutes total time today in preparation, patient care, nutritional counseling and documentation for this visit, including the following: review of clinical lab tests; review of medical tests/procedures/services.      Donald Brink, DO DABFM, DABOM [facility] [address] [place], [place] [address] [phone]

## 2022-12-17 NOTE — Assessment & Plan Note (Signed)
Lab Results  Component Value Date   HGBA1C 5.8 (H) 11/07/2022   He has been working on reducing his intake of added sugar, starches and increasing regular exercise.  He has seen improvements in A1c with a drop of 5.9-5.8.  Plan to recheck A1c in the next 4 months.  Continue working on a low sugar/low starch diet.

## 2022-12-18 ENCOUNTER — Telehealth: Payer: Self-pay | Admitting: Family Medicine

## 2022-12-18 NOTE — Telephone Encounter (Signed)
Prior authorization done via cover my meds for patients Zepbound. Waiting on determination.   

## 2022-12-18 NOTE — Telephone Encounter (Signed)
Prior authorization for patients Zepbound was approved. Patient has been notified.

## 2023-01-05 ENCOUNTER — Encounter: Payer: Self-pay | Admitting: Cardiology

## 2023-01-05 DIAGNOSIS — I959 Hypotension, unspecified: Secondary | ICD-10-CM

## 2023-01-08 NOTE — Telephone Encounter (Signed)
Probably the best thing would be to have PRN midodrine 2.5 mg up to 3 times a day (every 8 hours) for systolic blood pressure less than 95 mmHg.  Bryan Lemma, MD

## 2023-01-09 MED ORDER — MIDODRINE HCL 2.5 MG PO TABS: 2.5000 mg | ORAL_TABLET | Freq: Three times a day (TID) | ORAL | 0 refills | Status: DC | PRN

## 2023-01-09 MED ORDER — MIDODRINE HCL 2.5 MG PO TABS
2.5000 mg | ORAL_TABLET | Freq: Three times a day (TID) | ORAL | Status: DC | PRN
Start: 2023-01-09 — End: 2023-01-09

## 2023-01-10 NOTE — Telephone Encounter (Signed)
I am not sure how to answer these questions without actually seeing him and trying to put together.  If he could be into be seen that be great either by me and APP we can assess.  Having Coronary Calcium Score this low does not necessarily mean there is no obstructive disease.  Further testing can be done.  It does not look much has been done besides renal artery Dopplers in the Coronary Calcium Score and I was last year.  I have not seen him in 6 months and he was not having issues then.  Bryan Lemma, MD

## 2023-01-16 ENCOUNTER — Ambulatory Visit: Payer: Commercial Managed Care - PPO | Admitting: Family Medicine

## 2023-01-16 ENCOUNTER — Encounter: Payer: Self-pay | Admitting: Family Medicine

## 2023-01-16 VITALS — BP 107/73 | HR 103 | Temp 98.3°F | Ht 66.0 in | Wt 159.0 lb

## 2023-01-16 DIAGNOSIS — R55 Syncope and collapse: Secondary | ICD-10-CM

## 2023-01-16 DIAGNOSIS — G47411 Narcolepsy with cataplexy: Secondary | ICD-10-CM | POA: Insufficient documentation

## 2023-01-16 DIAGNOSIS — R7303 Prediabetes: Secondary | ICD-10-CM | POA: Diagnosis not present

## 2023-01-16 DIAGNOSIS — E669 Obesity, unspecified: Secondary | ICD-10-CM

## 2023-01-16 DIAGNOSIS — R29898 Other symptoms and signs involving the musculoskeletal system: Secondary | ICD-10-CM | POA: Diagnosis not present

## 2023-01-16 DIAGNOSIS — Z6825 Body mass index (BMI) 25.0-25.9, adult: Secondary | ICD-10-CM

## 2023-01-16 DIAGNOSIS — R42 Dizziness and giddiness: Secondary | ICD-10-CM | POA: Diagnosis not present

## 2023-01-16 MED ORDER — ZEPBOUND 7.5 MG/0.5ML ~~LOC~~ SOAJ
7.5000 mg | SUBCUTANEOUS | 0 refills | Status: DC
Start: 2023-01-16 — End: 2023-02-18

## 2023-01-16 NOTE — Assessment & Plan Note (Signed)
New.  He has messaged his cardiologist about new onset exertional hypotension and monitored SBPs in the 80s with tachycardia.  He feels lightheaded but denies CP when this occurs.  Denies previous dx of POTS and is OFF BP meds.  He was prescribed midodrine to use prn and has taken one dose.  He has increased water and carb intake and is not skipping meals.  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  He agrees to a Neuro referral for eval and treatment of postural dizziness with presyncope, possibly POTS syndrome

## 2023-01-16 NOTE — Progress Notes (Signed)
Office: [phone]  /  Fax: [phone]  WEIGHT SUMMARY AND BIOMETRICS  Starting Date: 09/05/22  Starting Weight: 184lb   Weight Lost Since Last Visit: 5lb   Vitals Temp: 98.3 F (36.8 C) BP: 107/73 Pulse Rate: (!) 103 SpO2: 99 %   Body Composition  Body Fat %: 23 % Fat Mass (lbs): 36.6 lbs Muscle Mass (lbs): 116.2 lbs Total Body Water (lbs): 85.4 lbs Visceral Fat Rating : 12     HPI  Chief Complaint: OBESITY  Donald Hubbard is here to discuss his progress with his obesity treatment plan. He is on the the Category 2 Plan and states he is following his eating plan approximately 90 % of the time. He states he is exercising 0 minutes 0 times per week.   Interval History:  Since last office visit he is down 5 lb He is down 1.4 lb of muscle mass and down 4.2 lb of body fat in the past month This is a 25 lb total body weight lost in the past 4 mos of medically supervised weight management This is a 13.5 % TBW loss using Zepbound + healthy lifestyle changes with a reduced kcal diet He has seen 8.2 lb of muscle loss in 4 mos with is a 32% of his total weight loss He has been having exertional hypotension when working out in the yard and this has limited his exercise.  He denies CP but has had associated tachycardia Denies meal skipping but has improved satiety from Zepbound 7.5 mg weekly He did add back in some healthy carbs throughout the day and increased water intake  Pharmacotherapy: Zepbound 7.5 mg weekly  PHYSICAL EXAM:  Blood pressure 107/73, pulse (!) 103, temperature 98.3 F (36.8 C), height 5\' 6"  (1.676 m), weight 159 lb (72.1 kg), SpO2 99%. Body mass index is 25.66 kg/m.  General: He is overweight, cooperative, alert, well developed, and in no acute distress. PSYCH: Has normal mood, affect and thought process.   Lungs: Normal breathing effort, no conversational dyspnea.   ASSESSMENT AND PLAN  TREATMENT PLAN FOR OBESITY:  Recommended Dietary  Goals  Donald Hubbard is currently in the action stage of change. As such, his goal is to continue weight management plan. He has agreed to keeping a food journal and adhering to recommended goals of 1500 calories and 90 g of  protein.  Behavioral Intervention  We discussed the following Behavioral Modification Strategies today: increasing lean protein intake, decreasing simple carbohydrates , increasing vegetables, increasing lower glycemic fruits, increasing fiber rich foods, avoiding skipping meals, increasing water intake, work on meal planning and preparation, continue to practice mindfulness when eating, and planning for success.  Additional resources provided today: NA  Recommended Physical Activity Goals  [name] has been advised to work up to 150 minutes of moderate intensity aerobic activity a week and strengthening exercises 2-3 times per week for cardiovascular health, weight loss maintenance and preservation of muscle mass.   He has agreed to Think about ways to increase daily physical activity and overcoming barriers to exercise - wait workup by ortho for L forearm pain and weakness and neuro visit for postural / exertional hypotension  Pharmacotherapy changes for the treatment of obesity: none  ASSOCIATED CONDITIONS ADDRESSED TODAY  Postural dizziness with presyncope Assessment & Plan: New.  He has messaged his cardiologist about new onset exertional hypotension and monitored SBPs in the 80s with tachycardia.  He feels lightheaded but denies CP when this occurs.  Denies previous dx of POTS and is  OFF BP meds.  He was prescribed midodrine to use prn and has taken one dose.  He has increased water and carb intake and is not skipping meals.  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  He agrees to a Neuro referral for eval and treatment of postural  dizziness with presyncope, possibly POTS syndrome  Orders: -     Ambulatory referral to Neurology  Generalized obesity with starting BMI 30.6 -     Zepbound; Inject 7.5 mg into the skin once a week.  Dispense: 2 mL; Refill: 0  BMI 25.0-25.9,adult  Transient total loss of muscle tone Assessment & Plan: Reviewed bioimpedence results.  Muscle loss has slowed down with increasing daily kcals including protein intake.  Weight training has been halted due to L forearm pain and exertional dizziness.  He has lost 32% of his TBW in muscle loss with a goal of < 25%.    Continue to include lean protein with meals and snack for a target 90-100 g/ day Resistance training to resume once workup from ortho and neuro is completed   Prediabetes Assessment & Plan: Lab Results  Component Value Date   HGBA1C 5.8 (H) 11/07/2022   Improving.  He has seen a 0.1 point reduction in A1c with lifestyle changes and anticipate further improvements given his 13.5% TBW loss on Zepbound.    Continue to limit high sugar foods and drinks OK to include high fiber carbs, fruit with meals and snacks Recheck A1c in 3 mos       He was informed of the importance of frequent follow up visits to maximize his success with intensive lifestyle modifications for his multiple health conditions.   ATTESTASTION STATEMENTS:  Reviewed by clinician on day of visit: allergies, medications, problem list, medical history, surgical history, family history, social history, and previous encounter notes pertinent to obesity diagnosis.   I have personally spent 30 minutes total time today in preparation, patient care, nutritional counseling and documentation for this visit, including the following: review of clinical lab tests; review of medical tests/procedures/services.      Glennis Brink, DO DABFM, DABOM [facility] [address] [place], [place] [address] [phone]

## 2023-01-16 NOTE — Assessment & Plan Note (Signed)
Lab Results  Component Value Date   HGBA1C 5.8 (H) 11/07/2022   Improving.  He has seen a 0.1 point reduction in A1c with lifestyle changes and anticipate further improvements given his 13.5% TBW loss on Zepbound.    Continue to limit high sugar foods and drinks OK to include high fiber carbs, fruit with meals and snacks Recheck A1c in 3 mos

## 2023-01-16 NOTE — Assessment & Plan Note (Signed)
Reviewed bioimpedence results.  Muscle loss has slowed down with increasing daily kcals including protein intake.  Weight training has been halted due to L forearm pain and exertional dizziness.  He has lost 32% of his TBW in muscle loss with a goal of < 25%.    Continue to include lean protein with meals and snack for a target 90-100 g/ day Resistance training to resume once workup from ortho and neuro is completed

## 2023-01-17 ENCOUNTER — Telehealth: Payer: Self-pay | Admitting: *Deleted

## 2023-01-17 NOTE — Telephone Encounter (Signed)
Referral faxed over to [facility]ist(Dr. Trena Platt). Office notes and demographics sent. Patient notified.

## 2023-01-18 ENCOUNTER — Ambulatory Visit (HOSPITAL_BASED_OUTPATIENT_CLINIC_OR_DEPARTMENT_OTHER): Payer: Commercial Managed Care - PPO | Admitting: Orthopaedic Surgery

## 2023-01-18 ENCOUNTER — Ambulatory Visit (HOSPITAL_BASED_OUTPATIENT_CLINIC_OR_DEPARTMENT_OTHER): Payer: Commercial Managed Care - PPO | Admitting: Student

## 2023-01-22 MED ORDER — ONDANSETRON HCL 4 MG PO TABS: 4.0000 mg | ORAL_TABLET | Freq: Three times a day (TID) | ORAL | 0 refills | Status: DC | PRN

## 2023-02-01 ENCOUNTER — Other Ambulatory Visit: Payer: Self-pay | Admitting: Cardiology

## 2023-02-11 ENCOUNTER — Encounter: Payer: Self-pay | Admitting: Cardiology

## 2023-02-11 ENCOUNTER — Ambulatory Visit: Payer: Commercial Managed Care - PPO | Attending: Cardiology | Admitting: Cardiology

## 2023-02-11 VITALS — BP 157/99 | HR 92 | Ht 66.0 in | Wt 162.2 lb

## 2023-02-11 DIAGNOSIS — I10 Essential (primary) hypertension: Secondary | ICD-10-CM | POA: Diagnosis not present

## 2023-02-11 DIAGNOSIS — R42 Dizziness and giddiness: Secondary | ICD-10-CM

## 2023-02-11 DIAGNOSIS — G720 Drug-induced myopathy: Secondary | ICD-10-CM

## 2023-02-11 DIAGNOSIS — T466X5D Adverse effect of antihyperlipidemic and antiarteriosclerotic drugs, subsequent encounter: Secondary | ICD-10-CM

## 2023-02-11 DIAGNOSIS — E785 Hyperlipidemia, unspecified: Secondary | ICD-10-CM | POA: Diagnosis not present

## 2023-02-11 DIAGNOSIS — R55 Syncope and collapse: Secondary | ICD-10-CM | POA: Diagnosis not present

## 2023-02-11 DIAGNOSIS — Z8249 Family history of ischemic heart disease and other diseases of the circulatory system: Secondary | ICD-10-CM

## 2023-02-11 NOTE — Patient Instructions (Signed)
Medication Instructions:  Continue with all current  medications    Continue taking Amlodipine  for elevated blood pressure  and Midodrine for  dizziness  Suggest  if you will have an active day and blood pressure are 110 or lower go ahead  take the Midodrine  with a full glass of water   *If you need a refill on your cardiac medications before your next appointment, please call your pharmacy*   Lab Work: Not needed    Testing/Procedures:    Follow-Up: At [facility], you and your health needs are our priority.  As part of our continuing mission to provide you with exceptional heart care, we have created designated Provider Care Teams.  These Care Teams include your primary Cardiologist (physician) and Advanced Practice Providers (APPs -  Physician Assistants and Nurse Practitioners) who all work together to provide you with the care you need, when you need it.     Your next appointment:   Keep appointment in Jan 2025   The format for your next appointment:   In Person  Provider:   Bryan Lemma, MD    Other Instructions    Stay Hydrated. - 80 ounces of water (or water based drinks -- not sodas, tea or coffee) per day.  Every third bottle should be an electrolyte drink

## 2023-02-11 NOTE — Assessment & Plan Note (Signed)
Allow mild permissive hypertension and hold off on standing dose of BP med.   I suspect that with a combination of Zepbound and his tendency to avoid drinking, he is ending up again dehydrated which contributed to his constipation but also to his orthostasis.  Stay Hydrated. - 80 ounces of water (or water based drinks -- not sodas, tea or coffee) per day.  Every third bottle should be an electrolyte drink  Use Midodrine for  dizziness/low blood pressures Suggest  if you will have an active day and blood pressure are 110 or lower go ahead  take the Midodrine  with a full glass of water

## 2023-02-11 NOTE — Assessment & Plan Note (Signed)
Labs from July are very reassuring in comparison to April 2024.  Total cholesterol is down dramatically from 337 LDL has decreased from 246 to a current level of 77.  Remarkable results.

## 2023-02-11 NOTE — Assessment & Plan Note (Signed)
Pretty much tried all of the standard statin medications as well as Zetia and was not able to tolerate due to profound myalgias.  With familial hyperlipidemia, was placed on.  Lipids are much better controlled.  Follows intermittently with Dr. Rennis Golden from lipid clinic.

## 2023-02-11 NOTE — Progress Notes (Signed)
Cardiology Office Note:  .   Date:  02/11/2023  ID:  Donald Hubbard, DOB [birth year], MRN [id number] PCP: Joycelyn Rua, MD  [facility] Providers Cardiologist:  Bryan Lemma, MD     Chief Complaint  Patient presents with   Follow-up    Episodic hypotension    Patient Profile: .     Donald Hubbard is a previously obese 62 y.o. male with a PMH notable for Likely Familial Hyperlipidemia (statin myalgias, and also intolerant of Zetia) who presents here for 73-month follow-up to discuss low blood pressure at the request of Joycelyn Rua, MD.    Donald Hubbard was last seen on August 13, 2022 for routine follow-up.  I referred him to lipid clinic because he had been intolerant of despite any statin and Zetia.  Labile blood pressures.  Trying to avoid any diuretics.  Plan was to use as needed amlodipine for elevated SBP greater than 140.  Reassuring results of Coronary Calcium Score 0.  Donald Hubbard was most recently seen by Dr. Rennis Golden on 11/09/2022 for lipid management.  He had been started on Repatha and tolerating relatively well.  He was also started on Zepbound with some weight loss and mild drop in A1c.  Subjective  He was seen by Dr. Honor Loh 01/16/2023 for dizziness.  Heart rate is 103 bpm.  Blood pressure was 107/73 mmHg.  He was noticing postural dizziness and presyncope.  Was referred to neurology and sent back to cardiology for possible POTS.  Telephone call from patient's wife following a visit to see Dr. Cathey Endow for her rash.  But it can be there is but also noted low blood pressures concerning for POTS.  The patient is currently on a regimen of Repatha and Zepbound for cholesterol management. The patient reports occasional use of amlodipine for blood pressure control when readings exceed 145, and Midodrine for episodes of low blood pressure. The patient also reports a decrease in alcohol consumption and an increase in hydration to manage blood pressure fluctuations. The patient has  noted some constipation, which he attributes to decreased food and fluid intake due to the Zepbound.  The patient also reports a recent episode of a rash on the leg after sitting in the sun during a period of low blood pressure. The patient also reports a recent onset of lip twitching when at rest, and ongoing pain in the arm following a blood draw. The patient continues to work and engage in physical activities such as yard work, despite these symptoms.   Cardiovascular ROS: positive for - dyspnea on exertion, palpitations, shortness of breath, and fatigue and near syncope with drops in blood pressure.  Orthostatic. negative for - chest pain, edema, orthopnea, paroxysmal nocturnal dyspnea, or true syncope, TIA or amaurosis fugax, claudication  ROS:  Review of Systems - Negative except episodic fatigue and dizziness, constipation.    Objective  Studies Reviewed: Marland Kitchen   EKG Interpretation Date/Time:  Monday February 11 2023 13:31:19 EDT Ventricular Rate:  81 PR Interval:  128 QRS Duration:  82 QT Interval:  388 QTC Calculation: 450 R Axis:   -23  Text Interpretation: Normal sinus rhythm Normal ECG When compared with ECG of 10-Jun-2017 15:17, Questionable change in QRS axis Nonspecific T wave abnormality now evident in Lateral leads Confirmed by Bryan Lemma ([id number]) on 02/11/2023 1:48:42 PM   Coronary Calcium Score 09/25/2021: Total score 0  Labs 11/07/2022: A1c 5.8; NMR panel-LDL particle number 837, LDL-C 77, HDL 57, TG 111, TC 154.  HDL particle #  34, small LDL particle number.  327.  LDL size 21.  Reassuring.  Risk Assessment/Calculations:           Physical Exam:   VS:  BP (!) 157/99   Pulse 92   Ht 5\' 6"  (1.676 m)   Wt 162 lb 3.2 oz (73.6 kg)   SpO2 100%   BMI 26.18 kg/m    Wt Readings from Last 3 Encounters:  02/11/23 162 lb 3.2 oz (73.6 kg)  01/16/23 159 lb (72.1 kg)  12/17/22 164 lb (74.4 kg)   Orthostatic VS for the past 24 hrs (Last 3 readings):  BP- Lying Pulse- Lying  BP- Sitting Pulse- Sitting BP- Standing at 0 minutes Pulse- Standing at 0 minutes BP- Standing at 3 minutes Pulse- Standing at 3 minutes  02/11/23 1341 (!) 160/96 86 (!) 157/99 92 143/90 90 (!) 154/93 98    GEN: Well nourished, well developed in no acute distress; healthy-appearing.  No noticeable weight loss NECK: No JVD; No carotid bruits CARDIAC: Normal S1, S2; RRR, no murmurs, rubs, gallops RESPIRATORY:  Clear to auscultation without rales, wheezing or rhonchi ; nonlabored, good air movement. ABDOMEN: Soft, non-tender, non-distended EXTREMITIES:  No edema; No deformity => showed me a picture of the episode of rash on the leg that had a reticular appearance, but was somewhat red and not violaceous.  No longer present.    ASSESSMENT AND PLAN: .    Problem List Items Addressed This Visit       Cardiology Problems   Essential hypertension (Chronic)    He does have elevated blood pressures, but with him having significant orthostatic symptoms I am really worried about being overly aggressive and will avoid a basal dose of medication. Continue taking Amlodipine  for elevated blood pressure over 150 mmHg.   Maintain adequate hydration to avoid hypotension and orthostasis.      Hyperlipidemia with target LDL less than 100 (Chronic)    Labs from July are very reassuring in comparison to April 2024.  Total cholesterol is down dramatically from 337 LDL has decreased from 246 to a current level of 77.  Remarkable results.      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   I suspect that with a combination of Zepbound and his tendency to avoid drinking, he is ending up again dehydrated which contributed to his constipation but also to his orthostasis.  Stay Hydrated. - 80 ounces of water (or water based drinks -- not sodas, tea or coffee) per day.  Every third  bottle should be an electrolyte drink  Use Midodrine for  dizziness/low blood pressures Suggest  if you will have an active day and blood pressure are 110 or lower go ahead  take the Midodrine  with a full glass of water         Other   Family history of early CAD (Chronic)    Low Coronary Calcium Score of 0 was very reassuring.  However because of his statin intolerance as well as intolerance to just about anything for lipids he is now on Repatha.  Lipids been pretty well-controlled and his BP is somewhat labile so we are holding off on treating with standing meds-allowing for permissive hypertension.  He is also working on weight loss with Zepbound.  I think this is probably contributing to his dehydration.  Not  currently taking aspirin-not indicated at this low level coronary calcium.      Statin myopathy (Chronic)    Pretty much tried all of the standard statin medications as well as Zetia and was not able to tolerate due to profound myalgias.  With familial hyperlipidemia, was placed on.  Lipids are much better controlled.  Follows intermittently with Dr. Rennis Golden from lipid clinic.               Dispo: Return in about 3 months (around 05/14/2023) for 3-4 month follow-up.  Total time spent: 29 min spent with patient + 18 min spent charting = 47 min     Signed, Marykay Lex, MD, MS Bryan Lemma, M.D., M.S. Interventional Cardiologist  [facility]  Pager # [phone] Phone # [phone] [address] [place], [place] [address]

## 2023-02-11 NOTE — Assessment & Plan Note (Signed)
Low Coronary Calcium Score of 0 was very reassuring.  However because of his statin intolerance as well as intolerance to just about anything for lipids he is now on Repatha.  Lipids been pretty well-controlled and his BP is somewhat labile so we are holding off on treating with standing meds-allowing for permissive hypertension.  He is also working on weight loss with Zepbound.  I think this is probably contributing to his dehydration.  Not currently taking aspirin-not indicated at this low level coronary calcium.

## 2023-02-11 NOTE — Assessment & Plan Note (Signed)
He does have elevated blood pressures, but with him having significant orthostatic symptoms I am really worried about being overly aggressive and will avoid a basal dose of medication. Continue taking Amlodipine  for elevated blood pressure over 150 mmHg.   Maintain adequate hydration to avoid hypotension and orthostasis.

## 2023-02-18 ENCOUNTER — Ambulatory Visit (INDEPENDENT_AMBULATORY_CARE_PROVIDER_SITE_OTHER): Payer: Commercial Managed Care - PPO | Admitting: Family Medicine

## 2023-02-18 ENCOUNTER — Encounter: Payer: Self-pay | Admitting: Family Medicine

## 2023-02-18 VITALS — BP 161/101 | HR 83 | Temp 97.8°F | Ht 66.0 in | Wt 155.0 lb

## 2023-02-18 DIAGNOSIS — R110 Nausea: Secondary | ICD-10-CM

## 2023-02-18 DIAGNOSIS — I10 Essential (primary) hypertension: Secondary | ICD-10-CM

## 2023-02-18 DIAGNOSIS — K5901 Slow transit constipation: Secondary | ICD-10-CM | POA: Diagnosis not present

## 2023-02-18 DIAGNOSIS — E669 Obesity, unspecified: Secondary | ICD-10-CM | POA: Diagnosis not present

## 2023-02-18 DIAGNOSIS — Z6825 Body mass index (BMI) 25.0-25.9, adult: Secondary | ICD-10-CM

## 2023-02-18 MED ORDER — LUBIPROSTONE 8 MCG PO CAPS: 8.0000 ug | ORAL_CAPSULE | Freq: Every day | ORAL | 0 refills | Status: DC

## 2023-02-18 MED ORDER — ONDANSETRON HCL 4 MG PO TABS: 4.0000 mg | ORAL_TABLET | Freq: Three times a day (TID) | ORAL | 0 refills | Status: DC | PRN

## 2023-02-18 MED ORDER — ONDANSETRON 8 MG PO TBDP: 8.0000 mg | ORAL_TABLET | Freq: Three times a day (TID) | ORAL | 0 refills | Status: DC | PRN

## 2023-02-18 MED ORDER — ZEPBOUND 7.5 MG/0.5ML ~~LOC~~ SOAJ
7.5000 mg | SUBCUTANEOUS | 0 refills | Status: DC
Start: 2023-02-18 — End: 2023-03-13

## 2023-02-18 NOTE — Progress Notes (Signed)
Office: [phone]  /  Fax: [phone]  WEIGHT SUMMARY AND BIOMETRICS  Starting Date: 09/05/22  Starting Weight: 184 lb   Weight Lost Since Last Visit: 4 lb   Vitals Temp: 97.8 F (36.6 C) BP: (!) 161/101 Pulse Rate: 83 SpO2: 99 %   Body Composition  Body Fat %: 23.1 % Fat Mass (lbs): 36 lbs Muscle Mass (lbs): 113.6 lbs Total Body Water (lbs): 84.6 lbs Visceral Fat Rating : 12    HPI  Chief Complaint: OBESITY  Donald Hubbard is here to discuss his progress with his obesity treatment plan. He is on the keeping a food journal and adhering to recommended goals of 1500 calories and 90 protein and states he is following his eating plan approximately 90 % of the time. He states he is exercising weights, walking, wake surfing, for 30-35 minutes 3-4 times per week.   Interval History:  Since last office visit he is down 4 lb This gives him a net weight loss of 29 lb in the past 5 mos of medically supervised weight management This is a 16% TBW loss He sees neurology tomorrow for possible POTS and also has L bicep pain and weakness following a blood draw.  This has limited his weight training He has been trying to drink more water He has been more physically active He is prioritizing protein intake  Muscle loss has been 33% of his TBW loss (slightly high)  Pharmacotherapy: Zepbound 7.5 mg weekly  PHYSICAL EXAM:  Blood pressure (!) 161/101, pulse 83, temperature 97.8 F (36.6 C), height 5\' 6"  (1.676 m), weight 155 lb (70.3 kg), SpO2 99%. Body mass index is 25.02 kg/m.  General: He is healthy appearing,  cooperative, alert, well developed, and in no acute distress. PSYCH: Has normal mood, affect and thought process.   Lungs: Normal breathing effort, no conversational dyspnea.   ASSESSMENT AND PLAN  TREATMENT PLAN FOR OBESITY:  Recommended Dietary Goals  [name] is currently in the action stage of change. As such, his goal is to continue weight management plan. He has  agreed to keeping a food journal and adhering to recommended goals of 1500 calories and 90+ g of  protein.  Behavioral Intervention  We discussed the following Behavioral Modification Strategies today: increasing lean protein intake to established goals, increasing vegetables, increasing lower glycemic fruits, keeping healthy foods at home, planning for success, and continue to work on maintaining a reduced calorie state, getting the recommended amount of protein, incorporating whole foods, making healthy choices, staying well hydrated and practicing mindfulness when eating..  Additional resources provided today: NA  Recommended Physical Activity Goals  [name] has been advised to work up to 150 minutes of moderate intensity aerobic activity a week and strengthening exercises 2-3 times per week for cardiovascular health, weight loss maintenance and preservation of muscle mass.   He has agreed to Work on scheduling and tracking physical activity.   Pharmacotherapy changes for the treatment of obesity: continue Zepbound at 7.5 mg weekly for 4 more weeks  ASSOCIATED CONDITIONS ADDRESSED TODAY  Nausea -     Ondansetron; Take 1 tablet (8 mg total) by mouth every 8 (eight) hours as needed for nausea or vomiting.  Dispense: 20 tablet; Refill: 0  Generalized obesity with starting BMI 30.6 -     Zepbound; Inject 7.5 mg into the skin once a week.  Dispense: 2 mL; Refill: 0  Slow transit constipation Assessment & Plan: Off of Linzess and has not liked taking Miralax on a  regular basis, he has had worsening constipation while on Zepbound. Recently increased his intake of water High protein diet with less fiber intake due to fullness from Zepbound has also contributed to constipation  Rx for Luiprostone started, take 8 mcg daily with food Increase water intake to 80+ oz/ day Increase fruits and veggies  Orders: -     Lubiprostone; Take 1 capsule (8 mcg total) by mouth daily with breakfast.   Dispense: 30 capsule; Refill: 0  BMI 25.0-25.9,adult  Essential hypertension Assessment & Plan: BP is higher today He had stopped amlodopine daily due to low BP readings and has a BP cuff at home to monitor. He did eat potato chips last night  Monitor home Bps and take Amlodopine once daily prn Avoid high sodium foods        He was informed of the importance of frequent follow up visits to maximize his success with intensive lifestyle modifications for his multiple health conditions.   ATTESTASTION STATEMENTS:  Reviewed by clinician on day of visit: allergies, medications, problem list, medical history, surgical history, family history, social history, and previous encounter notes pertinent to obesity diagnosis.   I have personally spent 30 minutes total time today in preparation, patient care, nutritional counseling and documentation for this visit, including the following: review of clinical lab tests; review of medical tests/procedures/services.      Glennis Brink, DO DABFM, DABOM [facility] [address] [place], [place] [address] [phone]

## 2023-02-18 NOTE — Assessment & Plan Note (Signed)
BP is higher today He had stopped amlodopine daily due to low BP readings and has a BP cuff at home to monitor. He did eat potato chips last night  Monitor home Bps and take Amlodopine once daily prn Avoid high sodium foods

## 2023-02-18 NOTE — Assessment & Plan Note (Signed)
Off of Linzess and has not liked taking Miralax on a regular basis, he has had worsening constipation while on Zepbound. Recently increased his intake of water High protein diet with less fiber intake due to fullness from Zepbound has also contributed to constipation  Rx for Luiprostone started, take 8 mcg daily with food Increase water intake to 80+ oz/ day Increase fruits and veggies

## 2023-03-13 ENCOUNTER — Ambulatory Visit: Payer: Commercial Managed Care - PPO | Admitting: Family Medicine

## 2023-03-13 ENCOUNTER — Encounter: Payer: Self-pay | Admitting: Family Medicine

## 2023-03-13 VITALS — BP 148/90 | HR 80 | Temp 98.0°F | Ht 66.0 in | Wt 156.0 lb

## 2023-03-13 DIAGNOSIS — R7303 Prediabetes: Secondary | ICD-10-CM

## 2023-03-13 DIAGNOSIS — E669 Obesity, unspecified: Secondary | ICD-10-CM | POA: Diagnosis not present

## 2023-03-13 DIAGNOSIS — Z6825 Body mass index (BMI) 25.0-25.9, adult: Secondary | ICD-10-CM

## 2023-03-13 DIAGNOSIS — K5901 Slow transit constipation: Secondary | ICD-10-CM | POA: Diagnosis not present

## 2023-03-13 DIAGNOSIS — I10 Essential (primary) hypertension: Secondary | ICD-10-CM

## 2023-03-13 MED ORDER — LUBIPROSTONE 24 MCG PO CAPS: 24.0000 ug | ORAL_CAPSULE | Freq: Every day | ORAL | 0 refills | Status: DC

## 2023-03-13 MED ORDER — ZEPBOUND 10 MG/0.5ML ~~LOC~~ SOAJ
10.0000 mg | SUBCUTANEOUS | 0 refills | Status: DC
Start: 2023-03-13 — End: 2023-04-10

## 2023-03-13 NOTE — Assessment & Plan Note (Signed)
BP's at home running 120s/ 80s Denies HA or CP  Monitor Bps at rest at home Has Amlodopine 2.5 mg to use prn

## 2023-03-13 NOTE — Assessment & Plan Note (Signed)
Worsened from Zepbound + high protein diet Has increased intake of water, fruits and veggies Did not see much improvement with the addition of Amitiza 8 mcg daily  Increase Amitiza to 24 mcg daily Increase non starchy veggie volume

## 2023-03-13 NOTE — Assessment & Plan Note (Signed)
Lab Results  Component Value Date   HGBA1C 5.8 (H) 11/07/2022   He has done well with weight reduction and is now at a healthy weight He has reduced intake of starches and sweets  Increase walking time to 30 min 5 days/ wk A1c may be rechecked by PCP tomorrow

## 2023-03-13 NOTE — Progress Notes (Signed)
Office: [phone]  /  Fax: [phone]  WEIGHT SUMMARY AND BIOMETRICS  Starting Date: 09/05/22  Starting Weight: 184lb   Weight Lost Since Last Visit: 0lb   Vitals Temp: 98 F (36.7 C) BP: (!) 148/90 Pulse Rate: 80 SpO2: 90 %   Body Composition  Body Fat %: 22.9 % Fat Mass (lbs): 35.8 lbs Muscle Mass (lbs): 114.8 lbs Total Body Water (lbs): 84.6 lbs Visceral Fat Rating : 12     HPI  Chief Complaint: OBESITY  Telly is here to discuss his progress with his obesity treatment plan. He is on the keeping a food journal and adhering to recommended goals of 1500 calories and 90 protein and states he is following his eating plan approximately 90 % of the time. He states he is exercising 60 minutes 3 times per week.   Interval History:  Since last office visit he is up 1 lb This gives him a net weight loss of 28 lb in 6 mos He is doing Zepbound 7.5 mg weekly with a bit more hunger 4 days post injection He is avoiding sweets He has good control over portion sizes He is mindful of his portion sizes He tries to prioritize intake of lean protein and fiber with meals Still having constipating side effects of Zepbound with Amitiza 8 mcg daily  Pharmacotherapy: Zepbound 7.5 mg weekly  PHYSICAL EXAM:  Blood pressure (!) 148/90, pulse 80, temperature 98 F (36.7 C), height 5\' 6"  (1.676 m), weight 156 lb (70.8 kg), SpO2 90%. Body mass index is 25.18 kg/m.  General: He is healthy appearing, cooperative, alert, well developed, and in no acute distress. PSYCH: Has normal mood, affect and thought process.   Lungs: Normal breathing effort, no conversational dyspnea.   ASSESSMENT AND PLAN  TREATMENT PLAN FOR OBESITY:  Recommended Dietary Goals  Terrall is currently in the action stage of change. As such, his goal is to continue weight management plan. He has agreed to keeping a food journal and adhering to recommended goals of 1600 calories and 90+ g of   protein.  Behavioral Intervention  We discussed the following Behavioral Modification Strategies today: increasing lean protein intake to established goals, increasing water intake , work on meal planning and preparation, keeping healthy foods at home, work on managing stress, creating time for self-care and relaxation, planning for success, and continue to work on maintaining a reduced calorie state, getting the recommended amount of protein, incorporating whole foods, making healthy choices, staying well hydrated and practicing mindfulness when eating..  Additional resources provided today: NA  Recommended Physical Activity Goals  Tralyn has been advised to work up to 150 minutes of moderate intensity aerobic activity a week and strengthening exercises 2-3 times per week for cardiovascular health, weight loss maintenance and preservation of muscle mass.   He has agreed to [name] strengthening exercises with a goal of 2-3 sessions a week  and Start aerobic activity with a goal of 150 minutes a week at moderate intensity.   Pharmacotherapy changes for the treatment of obesity: increase Zepbound to 10 mg weekly injection   ASSOCIATED CONDITIONS ADDRESSED TODAY  Slow transit constipation Assessment & Plan: Worsened from Zepbound + high protein diet Has increased intake of water, fruits and veggies Did not see much improvement with the addition of Amitiza 8 mcg daily  Increase Amitiza to 24 mcg daily Increase non starchy veggie volume  Orders: -     Lubiprostone; Take 1 capsule (24 mcg total) by mouth daily with  breakfast.  Dispense: 90 capsule; Refill: 0  Generalized obesity with starting BMI 30.6 Assessment & Plan: Reviewed progress over time His Body Fat % is now 22 which is normal He would like to lose 5 more pounds but we discussed focusing on a healthy weight and regular exercise may help more than increasing Zepbound to 10 mg.  Due to slight increased hunger and upcoming holidays,  will increase Zepbound to 10 mg weekly.    Orders: -     Zepbound; Inject 10 mg into the skin once a week.  Dispense: 2 mL; Refill: 0  Essential hypertension Assessment & Plan: BP's at home running 120s/ 80s Denies HA or CP  Monitor Bps at rest at home Has Amlodopine 2.5 mg to use prn   BMI 25.0-25.9,adult  Prediabetes Assessment & Plan: Lab Results  Component Value Date   HGBA1C 5.8 (H) 11/07/2022   He has done well with weight reduction and is now at a healthy weight He has reduced intake of starches and sweets  Increase walking time to 30 min 5 days/ wk A1c may be rechecked by PCP tomorrow       He was informed of the importance of frequent follow up visits to maximize his success with intensive lifestyle modifications for his multiple health conditions.   ATTESTASTION STATEMENTS:  Reviewed by clinician on day of visit: allergies, medications, problem list, medical history, surgical history, family history, social history, and previous encounter notes pertinent to obesity diagnosis.   I have personally spent 30 minutes total time today in preparation, patient care, nutritional counseling and documentation for this visit, including the following: review of clinical lab tests; review of medical tests/procedures/services.      Glennis Brink, DO DABFM, DABOM [facility] [address] [place], [place] [address] [phone]

## 2023-03-13 NOTE — Assessment & Plan Note (Signed)
Reviewed progress over time His Body Fat % is now 22 which is normal He would like to lose 5 more pounds but we discussed focusing on a healthy weight and regular exercise may help more than increasing Zepbound to 10 mg.  Due to slight increased hunger and upcoming holidays, will increase Zepbound to 10 mg weekly.

## 2023-04-10 ENCOUNTER — Ambulatory Visit: Payer: Commercial Managed Care - PPO | Admitting: Family Medicine

## 2023-04-10 ENCOUNTER — Encounter: Payer: Self-pay | Admitting: Family Medicine

## 2023-04-10 VITALS — BP 122/77 | HR 95 | Temp 98.2°F | Ht 66.0 in | Wt 151.0 lb

## 2023-04-10 DIAGNOSIS — K5901 Slow transit constipation: Secondary | ICD-10-CM | POA: Diagnosis not present

## 2023-04-10 DIAGNOSIS — Z6824 Body mass index (BMI) 24.0-24.9, adult: Secondary | ICD-10-CM | POA: Diagnosis not present

## 2023-04-10 DIAGNOSIS — E669 Obesity, unspecified: Secondary | ICD-10-CM | POA: Diagnosis not present

## 2023-04-10 MED ORDER — LUBIPROSTONE 24 MCG PO CAPS: 24.0000 ug | ORAL_CAPSULE | Freq: Every day | ORAL | 0 refills | Status: DC

## 2023-04-10 MED ORDER — ZEPBOUND 7.5 MG/0.5ML ~~LOC~~ SOAJ: 7.5000 mg | SUBCUTANEOUS | 0 refills | Status: DC

## 2023-04-10 NOTE — Assessment & Plan Note (Signed)
Improved on Amitiza 24 mcg daily for drug induced constipation Denies adverse SE Doing well with increased water and fiber intake  Continue Amitiza 24 mcg daily while on Zebpound

## 2023-04-10 NOTE — Progress Notes (Signed)
Office: [phone]  /  Fax: [phone]  WEIGHT SUMMARY AND BIOMETRICS  Starting Date: 09/05/22  Starting Weight: 184lb   Weight Lost Since Last Visit: 5lb   Vitals Temp: 98.2 F (36.8 C) BP: 122/77 Pulse Rate: 95 SpO2: 99 %   Body Composition  Body Fat %: 21.1 % Fat Mass (lbs): 32 lbs Muscle Mass (lbs): 113.2 lbs Total Body Water (lbs): 83.4 lbs Visceral Fat Rating : 11   HPI  Chief Complaint: OBESITY  Donald Hubbard is here to discuss his progress with his obesity treatment plan. He is on the practicing portion control and making smarter food choices, such as increasing vegetables and decreasing simple carbohydrates and states he is following his eating plan approximately 95 % of the time. He states he is exercising 30-60 minutes 3-4 times per week.  Interval History:  Since last office visit he is down 5 lb He has a net weight loss of 33 lb in the past 7 mos of medically supervised weight management This is a 17.9% TBW loss He has lost >30% muscle mass He has been able to add back in weight training and is walking a mile daily He has been more mindful of protein intake He has worked on improving hydration  Pharmacotherapy: Zepbound 10 mg weekly  PHYSICAL EXAM:  Blood pressure 122/77, pulse 95, temperature 98.2 F (36.8 C), height 5\' 6"  (1.676 m), weight 151 lb (68.5 kg), SpO2 99%. Body mass index is 24.37 kg/m.  General: He is healthy appearing,  cooperative, alert, well developed, and in no acute distress. PSYCH: Has normal mood, affect and thought process.   Lungs: Normal breathing effort, no conversational dyspnea.   ASSESSMENT AND PLAN  TREATMENT PLAN FOR OBESITY:  Recommended Dietary Goals  Donald Hubbard is currently in the action stage of change. As such, his goal is to continue weight management plan. He has agreed to practicing portion control and making smarter food choices, such as increasing vegetables and decreasing simple  carbohydrates.  Behavioral Intervention  We discussed the following Behavioral Modification Strategies today: increasing lean protein intake to established goals, increasing fiber rich foods, increasing water intake , work on meal planning and preparation, keeping healthy foods at home, planning for success, and continue to work on maintaining a reduced calorie state, getting the recommended amount of protein, incorporating whole foods, making healthy choices, staying well hydrated and practicing mindfulness when eating..  Additional resources provided today: NA  Recommended Physical Activity Goals  Donald Hubbard has been advised to work up to 150 minutes of moderate intensity aerobic activity a week and strengthening exercises 2-3 times per week for cardiovascular health, weight loss maintenance and preservation of muscle mass.   He has agreed to Increase the intensity, frequency or duration of strengthening exercises   Pharmacotherapy changes for the treatment of obesity: reduce Zepbound to 7.5 mg weekly  ASSOCIATED CONDITIONS ADDRESSED TODAY  Slow transit constipation Assessment & Plan: Improved on Amitiza 24 mcg daily for drug induced constipation Denies adverse SE Doing well with increased water and fiber intake  Continue Amitiza 24 mcg daily while on Zebpound  Orders: -     Lubiprostone; Take 1 capsule (24 mcg total) by mouth daily with breakfast.  Dispense: 90 capsule; Refill: 0  Generalized obesity Assessment & Plan: Initial BMI 30 and now normal at 24 with a normal BF% He has done well with healthy lifestyle changes while on Zepbound for the past 7 mos with a 17.9% TBW loss Nausea has improved Hydration and  constipation have improved He plans to maintain an active lifestyle  Reduce Zepbound dose to 7.5 mg weekly and continue to reduce in the coming weeks  Orders: -     Zepbound; Inject 7.5 mg into the skin once a week.  Dispense: 2 mL; Refill: 0  BMI 24.0-24.9, adult       He was informed of the importance of frequent follow up visits to maximize his success with intensive lifestyle modifications for his multiple health conditions.   ATTESTASTION STATEMENTS:  Reviewed by clinician on day of visit: allergies, medications, problem list, medical history, surgical history, family history, social history, and previous encounter notes pertinent to obesity diagnosis.   I have personally spent 30 minutes total time today in preparation, patient care, nutritional counseling and documentation for this visit, including the following: review of clinical lab tests; review of medical tests/procedures/services.      Glennis Brink, DO DABFM, DABOM [facility] [address] [place], [place] [address] [phone]

## 2023-04-10 NOTE — Assessment & Plan Note (Signed)
Initial BMI 30 and now normal at 24 with a normal BF% He has done well with healthy lifestyle changes while on Zepbound for the past 7 mos with a 17.9% TBW loss Nausea has improved Hydration and constipation have improved He plans to maintain an active lifestyle  Reduce Zepbound dose to 7.5 mg weekly and continue to reduce in the coming weeks

## 2023-04-12 ENCOUNTER — Other Ambulatory Visit: Payer: Self-pay | Admitting: Internal Medicine

## 2023-04-12 DIAGNOSIS — E7849 Other hyperlipidemia: Secondary | ICD-10-CM

## 2023-05-10 ENCOUNTER — Ambulatory Visit: Payer: Commercial Managed Care - PPO | Attending: Cardiology | Admitting: Cardiology

## 2023-05-10 ENCOUNTER — Encounter: Payer: Self-pay | Admitting: Cardiology

## 2023-05-10 VITALS — BP 110/76 | HR 96 | Ht 66.0 in | Wt 156.0 lb

## 2023-05-10 DIAGNOSIS — E785 Hyperlipidemia, unspecified: Secondary | ICD-10-CM

## 2023-05-10 DIAGNOSIS — I10 Essential (primary) hypertension: Secondary | ICD-10-CM | POA: Diagnosis not present

## 2023-05-10 DIAGNOSIS — G720 Drug-induced myopathy: Secondary | ICD-10-CM

## 2023-05-10 DIAGNOSIS — R42 Dizziness and giddiness: Secondary | ICD-10-CM | POA: Diagnosis not present

## 2023-05-10 DIAGNOSIS — R634 Abnormal weight loss: Secondary | ICD-10-CM

## 2023-05-10 DIAGNOSIS — T466X5D Adverse effect of antihyperlipidemic and antiarteriosclerotic drugs, subsequent encounter: Secondary | ICD-10-CM

## 2023-05-10 DIAGNOSIS — R55 Syncope and collapse: Secondary | ICD-10-CM

## 2023-05-10 DIAGNOSIS — I479 Paroxysmal tachycardia, unspecified: Secondary | ICD-10-CM

## 2023-05-10 NOTE — Progress Notes (Unsigned)
Cardiology Office Note:  .   Date:  05/13/2023  ID:  Donald Hubbard, DOB 1960/08/11, MRN [id number] PCP: Joycelyn Rua, MD  [facility] Providers Cardiologist:  Bryan Lemma, MD     Chief Complaint  Patient presents with   Follow-up    Doing well.  No major issues.   Palpitations    Episodes of fast heart rate noted on smart watch, but otherwise stable.    Patient Profile: .     Donald Hubbard is a previously obese 63 y.o. male  with a PMH notable for Likely Familial Hyperlipidemia (statin myalgias, and also intolerant of Zetia) who presents here for 3 month f/u at the request of Joycelyn Rua, MD.  Donald Hubbard is being followed by Dr. Rennis Golden for assistance with lipid management-currently on Repatha and now on Zepbound.  Thankfully, his Coronary Calcium Score was 0.    Donald Hubbard was last seen on October 21st 2024 for postural dizziness and low blood pressures at the request of Dr. Izola Price. => Felt that some of the postural dizziness and orthostasis was related to dehydration exacerbated by Zepbound.  Recommended aggressive hydration with least 80 ounces of water a day including electrolytes.  Provided PRN for midodrine.  Subjective  Discussed the use of AI scribe software for clinical note transcription with the patient, who gave verbal consent to proceed.  History of Present Illness   The patient, with a history of hypertension (Labile) and hyperlipidemia (Familial with Statin Myopathy), has been managing his blood pressure with amlodipine and midodrine that are being used, taken as needed based on blood pressure readings and symptoms. He reports that he has been diligent about hydration, which he believes has improved his overall health.   He has been on Repatha for his hyperlipidemia. He has been managing his blood pressure well with the current regimen and has not had to take any additional medications for the past couple of weeks. He reports no swelling or other  complications.   His has been working on weight loss with Dietary Changes & Exercise along with Zepbound, which has resulted in a significant weight loss of approximately 30 pounds. He has been stable at this new weight for the past month and a half.   Having essentially reached his target weight range, his PCP has started weaning off this medication, and he anticipates being off it completely in the near future.  However, he has experienced episodes of tachycardia, with heart rates over 110 bpm while at rest. These episodes were detected by his watch, and he did not report any associated symptoms. He did not capture rhythm strips, only noted HR increases.    The patient is due for a follow-up with Dr. Rennis Golden (Lipid Clinic) in February, with plans to have his cholesterol and chemistry checked prior to that visit. He is also due to start Medicare in the coming months.      Cardiovascular ROS: no chest pain or dyspnea on exertion positive for - rapid heart rate negative for - edema, irregular heartbeat, orthopnea, palpitations, paroxysmal nocturnal dyspnea, shortness of breath, or tachycardia noted is asymptomatic just seen on his monitor.  No syncope or near syncope, TIA/CVA/amaurosis fugax, claudication  ROS:  Review of Systems - Negative except symptoms noted above    Objective    Current Meds  Medication Sig   acyclovir (ZOVIRAX) 800 MG tablet Take 1 tablet by mouth 2 (two) times daily as needed.   albuterol (VENTOLIN HFA) 108 (90 Base) MCG/ACT  inhaler Inhale into the lungs every 6 (six) hours as needed for wheezing or shortness of breath.   amLODipine (NORVASC) 2.5 MG tablet Take 2.5 mg  tablet in the afternoon if blood pressure is above 140 systolic , (Patient taking differently: Take 2.5 mg by mouth as needed. Take 2.5 mg  tablet in the afternoon if blood pressure is above 140 systolic ,)   amphetamine-dextroamphetamine (ADDERALL XR) 15 MG 24 hr capsule Take 15 mg by mouth every  morning.   cetirizine (ZYRTEC) 10 MG tablet Take 1 tablet by mouth as needed.   Cholecalciferol (VITAMIN D-3) 125 MCG (5000 UT) TABS Take 1 tablet by mouth every other day.   DiazePAM (VALIUM PO) Take 5 mg by mouth as needed.   Evolocumab (REPATHA SURECLICK) 140 MG/ML SOAJ Inject 140 mg into the skin every 14 (fourteen) days.   fluticasone (FLONASE) 50 MCG/ACT nasal spray Place 1 spray into both nostrils as needed.   GNP MELATONIN PO Take 10 mg by mouth.   HYDROcodone-acetaminophen (NORCO/VICODIN) 5-325 MG tablet Take 1 tablet by mouth every 6 (six) hours as needed for moderate pain.   ipratropium (ATROVENT) 0.06 % nasal spray Place 1 spray into both nostrils daily.   lansoprazole (PREVACID) 30 MG capsule Take 30 mg by mouth daily.   levocetirizine (XYZAL) 5 MG tablet Take 5 mg by mouth every evening.   lubiprostone (AMITIZA) 24 MCG capsule Take 1 capsule (24 mcg total) by mouth daily with breakfast.   MAGNESIUM GLYCINATE PO Take 350 mg by mouth at bedtime.   Menatetrenone (VITAMIN K2) 100 MCG TABS Take 100 mg by mouth daily.   midodrine (PROAMATINE) 2.5 MG tablet Take 1 tablet (2.5 mg total) by mouth 3 (three) times daily as needed (For systolic blood pressure less than 95 mmHg.).   ondansetron (ZOFRAN-ODT) 8 MG disintegrating tablet Take 1 tablet (8 mg total) by mouth every 8 (eight) hours as needed for nausea or vomiting.   PSYLLIUM PO Take by mouth.   tirzepatide (ZEPBOUND) 7.5 MG/0.5ML Pen Inject 7.5 mg into the skin once a week.    Studies Reviewed: .        Risk Assessment/Calculations:        Physical Exam:   VS:  BP 110/76   Pulse 96   Ht 5\' 6"  (1.676 m)   Wt 156 lb (70.8 kg)   SpO2 98%   BMI 25.18 kg/m    Wt Readings from Last 3 Encounters:  05/10/23 156 lb (70.8 kg)  04/10/23 151 lb (68.5 kg)  03/13/23 156 lb (70.8 kg)    GEN: Well nourished, well groomed  in no acute distress; Healthy appearing NECK: No JVD; No carotid bruits CARDIAC: Normal S1, S2; RRR, no  murmurs, rubs, gallops RESPIRATORY:  Clear to auscultation without rales, wheezing or rhonchi ; nonlabored, good air movement. ABDOMEN: Soft, non-tender, non-distended EXTREMITIES:  No edema; No deformity     ASSESSMENT AND PLAN: .    Problem List Items Addressed This Visit       Cardiology Problems   Essential hypertension (Chronic)   Managed with Amlodipine as needed when BP >145. No recent episodes of high blood pressure reported.   -Continue current management plan.      Hyperlipidemia with target LDL less than 100 - Primary (Chronic)   Coronary Calcium Score 0, but with familial hyperlipidemia on aggressive therapy. Statin intolerant. Managed with Repatha.   -Check lipid panel and chemistry before next appointment with Dr. Rennis Golden on 06/06/2023.  Paroxysmal tachycardia (HCC)   Recent episode of tachycardia (>110 bpm) while at rest, detected by patient's watch. No symptoms reported during the episode.   -If further episodes occur, discuss with Dr. Rennis Golden and consider wearing a monitor to determine rhythm.      Postural dizziness with presyncope (Chronic)   Similarly problematic episodes of orthostatic hypotension.  A lot of her mild permissive hypertension. Hopefully, as he weans off the Zepbound and his hydration improves, this to be less prominent. No recent episodes of low blood pressure reported.    -Continue to aggressively hydrate: 80 ounces of water (or water based drinks -- not sodas, tea or coffee) per day. Every third bottle should be an electrolyte drink  -Midodrine as needed when symptomatic or BP <95.        Other   Statin myopathy (Chronic)   Familial Hyperlipidemia.  Tried multiple different statins including Zetia.  Unable to tolerate due to myopathy/myalgias. -Managed by Dr. Rennis Golden from lipid clinic with Repatha.      Weight loss   Lost approximately 30 pounds since starting Zepbound. Currently in the process of being weaned off the medication.    -Continue current management plan.         Follow-Up: Return in about 7 months (around 12/08/2023) for Routine follow up with me. -approximately 6 months after his February appointment with Dr. Rennis Golden.      Signed, Marykay Lex, MD, MS Bryan Lemma, M.D., M.S. Interventional Cardiologist  [facility]  Pager # [phone] Phone # [phone] [address] [place], [place] [address]

## 2023-05-10 NOTE — Patient Instructions (Signed)
Medication Instructions:  No changes  *If you need a refill on your cardiac medications before your next appointment, please call your pharmacy*   Lab Work: Continue with instruction to have  NMR Liprofile completed prior to your visit with Dr Rennis Golden  in Feb 2025    If you have labs (blood work) drawn today and your tests are completely normal, you will receive your results only by: MyChart Message (if you have MyChart) OR A paper copy in the mail If you have any lab test that is abnormal or we need to change your treatment, we will call you to review the results.   Testing/Procedures: Not needed   Follow-Up: At [facility], you and your health needs are our priority.  As part of our continuing mission to provide you with exceptional heart care, we have created designated Provider Care Teams.  These Care Teams include your primary Cardiologist (physician) and Advanced Practice Providers (APPs -  Physician Assistants and Nurse Practitioners) who all work together to provide you with the care you need, when you need it.     Your next appointment:   7 month(s)  The format for your next appointment:   In Person  Provider:   Bryan Lemma, MD    Other Instructions   Stay [facility]   Continue with Instruction for High and/or Low Blood pressure regimen

## 2023-05-13 ENCOUNTER — Encounter: Payer: Self-pay | Admitting: Cardiology

## 2023-05-13 DIAGNOSIS — R634 Abnormal weight loss: Secondary | ICD-10-CM | POA: Insufficient documentation

## 2023-05-13 DIAGNOSIS — I479 Paroxysmal tachycardia, unspecified: Secondary | ICD-10-CM | POA: Insufficient documentation

## 2023-05-13 NOTE — Assessment & Plan Note (Signed)
Managed with Amlodipine as needed when BP >145. No recent episodes of high blood pressure reported.   -Continue current management plan.

## 2023-05-13 NOTE — Assessment & Plan Note (Signed)
Lost approximately 30 pounds since starting Zepbound. Currently in the process of being weaned off the medication.   -Continue current management plan.

## 2023-05-13 NOTE — Assessment & Plan Note (Signed)
Familial Hyperlipidemia.  Tried multiple different statins including Zetia.  Unable to tolerate due to myopathy/myalgias. -Managed by Dr. Rennis Golden from lipid clinic with Repatha.

## 2023-05-13 NOTE — Assessment & Plan Note (Signed)
Similarly problematic episodes of orthostatic hypotension.  A lot of her mild permissive hypertension. Hopefully, as he weans off the Zepbound and his hydration improves, this to be less prominent. No recent episodes of low blood pressure reported.    -Continue to aggressively hydrate: 80 ounces of water (or water based drinks -- not sodas, tea or coffee) per day. Every third bottle should be an electrolyte drink  -Midodrine as needed when symptomatic or BP <95.

## 2023-05-13 NOTE — Assessment & Plan Note (Signed)
Recent episode of tachycardia (>110 bpm) while at rest, detected by patient's watch. No symptoms reported during the episode.   -If further episodes occur, discuss with Dr. Rennis Golden and consider wearing a monitor to determine rhythm.

## 2023-05-13 NOTE — Assessment & Plan Note (Addendum)
Coronary Calcium Score 0, but with familial hyperlipidemia on aggressive therapy. Statin intolerant. Managed with Repatha.   -Check lipid panel and chemistry before next appointment with Dr. Rennis Golden on 06/06/2023.

## 2023-05-14 ENCOUNTER — Ambulatory Visit: Payer: Commercial Managed Care - PPO | Admitting: Family Medicine

## 2023-05-14 ENCOUNTER — Encounter: Payer: Self-pay | Admitting: Family Medicine

## 2023-05-14 VITALS — BP 127/83 | HR 85 | Temp 98.6°F | Ht 66.0 in | Wt 150.0 lb

## 2023-05-14 DIAGNOSIS — E669 Obesity, unspecified: Secondary | ICD-10-CM | POA: Diagnosis not present

## 2023-05-14 DIAGNOSIS — Z6824 Body mass index (BMI) 24.0-24.9, adult: Secondary | ICD-10-CM

## 2023-05-14 DIAGNOSIS — R110 Nausea: Secondary | ICD-10-CM | POA: Diagnosis not present

## 2023-05-14 DIAGNOSIS — K5901 Slow transit constipation: Secondary | ICD-10-CM | POA: Diagnosis not present

## 2023-05-14 MED ORDER — LUBIPROSTONE 8 MCG PO CAPS: 8.0000 ug | ORAL_CAPSULE | Freq: Every day | ORAL | 0 refills | Status: DC

## 2023-05-14 MED ORDER — ONDANSETRON 8 MG PO TBDP: 8.0000 mg | ORAL_TABLET | Freq: Three times a day (TID) | ORAL | 0 refills | Status: AC | PRN

## 2023-05-14 MED ORDER — ZEPBOUND 5 MG/0.5ML ~~LOC~~ SOAJ
5.0000 mg | SUBCUTANEOUS | 0 refills | Status: DC
Start: 2023-05-14 — End: 2023-06-19

## 2023-05-14 NOTE — Assessment & Plan Note (Signed)
Worsened by Zepbound use but improved on Amitiza Prefers 8 mcg vs 24 mcg dose Working on increasing water and fiber intake  Refilled Amitiza 8 mcg daily Aim for 90 oz of water intake and 30 g of dietary fiber daily

## 2023-05-14 NOTE — Assessment & Plan Note (Signed)
He has done well overall and is happy with progress Has moved to mainteance phase with a reduction in Zepbound dosage practicing mindful eating Aim for 150 min of cardio + 2 days/ wk of weight training weekly  Reduce Zepbound to 5 mg weekly with plans to d/c next visit

## 2023-05-14 NOTE — Progress Notes (Signed)
Office: [phone]  /  Fax: [phone]  WEIGHT SUMMARY AND BIOMETRICS  Starting Date: 09/05/22  Starting Weight: 184lb   Weight Lost Since Last Visit: 1lb   Vitals Temp: 98.6 F (37 C) BP: 127/83 Pulse Rate: 85 SpO2: 100 %   Body Composition  Body Fat %: 21.7 % Fat Mass (lbs): 32.6 lbs Muscle Mass (lbs): 111.6 lbs Total Body Water (lbs): 80.8 lbs Visceral Fat Rating : 12   HPI  Chief Complaint: OBESITY  Donald Hubbard is here to discuss his progress with his obesity treatment plan. He is on the practicing portion control and making smarter food choices, such as increasing vegetables and decreasing simple carbohydrates and states he is following his eating plan approximately 90 % of the time. He states he is exercising 30-60 minutes 2-3 times per week.  Interval History:  Since last office visit he is down 1 lb He did move down on Zepbound to 7.5 mg x 3 weeks Hunger slightly increased He is getting in fruits and veggies He has had a small portion of sweets He has a net weight loss of 40 lb in the past 7 mos He is happy to maintain his weight Wife if supportive  Pharmacotherapy: Zepbound 7.5 mg weekly  PHYSICAL EXAM:  Blood pressure 127/83, pulse 85, temperature 98.6 F (37 C), height 5\' 6"  (1.676 m), weight 150 lb (68 kg), SpO2 100%. Body mass index is 24.21 kg/m.  General: He is healthy appearing,  cooperative, alert, well developed, and in no acute distress. PSYCH: Has normal mood, affect and thought process.   Lungs: Normal breathing effort, no conversational dyspnea.   ASSESSMENT AND PLAN  TREATMENT PLAN FOR OBESITY:  Recommended Dietary Goals  Sedric is currently in the action stage of change. As such, his goal is to continue weight management plan. He has agreed to practicing portion control and making smarter food choices, such as increasing vegetables and decreasing simple carbohydrates.  Behavioral Intervention  We discussed the following  Behavioral Modification Strategies today: increasing lean protein intake to established goals, increasing fiber rich foods, increasing water intake , work on meal planning and preparation, continue to practice mindfulness when eating, planning for success, and continue to work on maintaining a reduced calorie state, getting the recommended amount of protein, incorporating whole foods, making healthy choices, staying well hydrated and practicing mindfulness when eating..  Additional resources provided today: NA  Recommended Physical Activity Goals  Corinne has been advised to work up to 150 minutes of moderate intensity aerobic activity a week and strengthening exercises 2-3 times per week for cardiovascular health, weight loss maintenance and preservation of muscle mass.   He has agreed to [name] strengthening exercises with a goal of 2-3 sessions a week   Pharmacotherapy changes for the treatment of obesity: reduce Zepbound to 5 mg weekly injection  ASSOCIATED CONDITIONS ADDRESSED TODAY  Slow transit constipation Assessment & Plan: Worsened by Zepbound use but improved on Amitiza Prefers 8 mcg vs 24 mcg dose Working on increasing water and fiber intake  Refilled Amitiza 8 mcg daily Aim for 90 oz of water intake and 30 g of dietary fiber daily  Orders: -     Lubiprostone; Take 1 capsule (8 mcg total) by mouth daily with breakfast.  Dispense: 30 capsule; Refill: 0  Generalized obesity with starting BMI 30.6 Assessment & Plan: He has done well overall and is happy with progress Has moved to mainteance phase with a reduction in Zepbound dosage practicing mindful eating Aim  for 150 min of cardio + 2 days/ wk of weight training weekly  Reduce Zepbound to 5 mg weekly with plans to d/c next visit  Orders: -     Zepbound; Inject 5 mg into the skin once a week.  Dispense: 2 mL; Refill: 0  Nausea -     Ondansetron; Take 1 tablet (8 mg total) by mouth every 8 (eight) hours as needed for  nausea or vomiting.  Dispense: 20 tablet; Refill: 0      He was informed of the importance of frequent follow up visits to maximize his success with intensive lifestyle modifications for his multiple health conditions.   ATTESTASTION STATEMENTS:  Reviewed by clinician on day of visit: allergies, medications, problem list, medical history, surgical history, family history, social history, and previous encounter notes pertinent to obesity diagnosis.   I have personally spent 30 minutes total time today in preparation, patient care, nutritional counseling and documentation for this visit, including the following: review of clinical lab tests; review of medical tests/procedures/services.      Glennis Brink, DO DABFM, DABOM [facility] [address] [place], [place] [address] [phone]

## 2023-05-22 ENCOUNTER — Encounter: Payer: Self-pay | Admitting: Cardiology

## 2023-05-22 MED ORDER — MIDODRINE HCL 2.5 MG PO TABS: 2.5000 mg | ORAL_TABLET | Freq: Three times a day (TID) | ORAL | 2 refills | Status: AC | PRN

## 2023-05-22 MED ORDER — AMLODIPINE BESYLATE 2.5 MG PO TABS: ORAL_TABLET | ORAL | 2 refills | Status: AC

## 2023-05-22 NOTE — Telephone Encounter (Signed)
Per 05/10/23 OV Note:    Cardiology Problems     Essential hypertension (Chronic)    Managed with Amlodipine as needed when BP >145. No recent episodes of high blood pressure reported.   -Continue current management plan.             Postural dizziness with presyncope (Chronic)    Similarly problematic episodes of orthostatic hypotension.  A lot of her mild permissive hypertension. Hopefully, as he weans off the Zepbound and his hydration improves, this to be less prominent. No recent episodes of low blood pressure reported.     -Continue to aggressively hydrate: 80 ounces of water (or water based drinks -- not sodas, tea or coffee) per day. Every third bottle should be an electrolyte drink  -Midodrine as needed when symptomatic or BP <95.        Refill sent for Midodrine and Amlodipine

## 2023-06-01 LAB — NMR, LIPOPROFILE
Cholesterol, Total: 172 mg/dL (ref 100–199)
HDL Particle Number: 37.2 umol/L (ref >=30.5)
HDL-C: 57 mg/dL (ref >39)
LDL Particle Number: 963 nmol/L (ref <1000)
LDL Size: 20.5 nmol — ABNORMAL LOW (ref >20.5)
LDL-C (NIH Calc): 82 mg/dL (ref 0–99)
LP-IR Score: 49 — ABNORMAL HIGH (ref <=45)
Small LDL Particle Number: 414 nmol/L (ref <=527)
Triglycerides: 197 mg/dL — ABNORMAL HIGH (ref 0–149)

## 2023-06-06 ENCOUNTER — Other Ambulatory Visit: Payer: Self-pay | Admitting: Family Medicine

## 2023-06-06 ENCOUNTER — Other Ambulatory Visit (HOSPITAL_COMMUNITY): Payer: Self-pay

## 2023-06-06 ENCOUNTER — Encounter (HOSPITAL_BASED_OUTPATIENT_CLINIC_OR_DEPARTMENT_OTHER): Payer: Self-pay | Admitting: Internal Medicine

## 2023-06-06 ENCOUNTER — Ambulatory Visit (HOSPITAL_BASED_OUTPATIENT_CLINIC_OR_DEPARTMENT_OTHER): Payer: Medicare Other | Admitting: Internal Medicine

## 2023-06-06 ENCOUNTER — Telehealth: Payer: Self-pay | Admitting: Pharmacy Technician

## 2023-06-06 ENCOUNTER — Telehealth (HOSPITAL_BASED_OUTPATIENT_CLINIC_OR_DEPARTMENT_OTHER): Payer: Self-pay

## 2023-06-06 VITALS — BP 138/90 | HR 85 | Ht 66.0 in | Wt 159.6 lb

## 2023-06-06 DIAGNOSIS — E7849 Other hyperlipidemia: Secondary | ICD-10-CM

## 2023-06-06 DIAGNOSIS — K5901 Slow transit constipation: Secondary | ICD-10-CM

## 2023-06-06 DIAGNOSIS — E785 Hyperlipidemia, unspecified: Secondary | ICD-10-CM

## 2023-06-06 DIAGNOSIS — T466X5D Adverse effect of antihyperlipidemic and antiarteriosclerotic drugs, subsequent encounter: Secondary | ICD-10-CM

## 2023-06-06 DIAGNOSIS — M791 Myalgia, unspecified site: Secondary | ICD-10-CM

## 2023-06-06 MED ORDER — REPATHA SURECLICK 140 MG/ML ~~LOC~~ SOAJ: 140.0000 mg | SUBCUTANEOUS | 3 refills | Status: AC

## 2023-06-06 NOTE — Progress Notes (Signed)
LIPID CLINIC CONSULT NOTE  Chief Complaint:  Manage dyslipidemia  Primary Care Physician: Joycelyn Rua, MD  Primary Cardiologist:  Bryan Lemma, MD  HPI:  Donald Hubbard is a 63 y.o. male who is being seen today for the evaluation of dyslipidemia at the request of Joycelyn Rua, MD. this is a pleasant 63 year old male kindly referred for evaluation management of dyslipidemia.  Unfortunately he has a strong family history of high cholesterol and heart disease.  His mother has had a stent and has high cholesterol.  His father had a four-vessel bypass.  His paternal grandmother also had coronary artery disease.  He has tried statins in the past but has been intolerant of both pravastatin and rosuvastatin as well as ezetimibe.  Most recently his lipids were significantly elevated with total cholesterol 337, HDL 58, triglycerides 172 and LDL of 246.  This is highly suggestive of a familial hyperlipidemia.  11/09/2022  Donald Hubbard returns today for follow-up.  He had recent blood work 2 days ago however his lipid profile is not yet resulted.  He has been on the Repatha.  No issues with it.  He did have a repeat A1c which came down 0.1%.  He has actually lost some weight on Zepbound.  06/06/2023  Donald Hubbard seen today in follow-up.  He has had a slight increase in his lipids since we last saw him.  LDL particle number now 963 with an LDL 82, HDL 57 and triglycerides 192.  He says that he has had to stretch out sometimes his Repatha injections.  He also has been decreasing his Zepbound due to some side effects and has gained some weight back.  Overall I think this explains the change in his numbers which are still not too bad.  PMHx:  Past Medical History:  Diagnosis Date   Adult ADHD    Back pain    Back pain    Chronic pain    GERD (gastroesophageal reflux disease)    Heart burn    High cholesterol    Hip pain    History of viral pericarditis 2000   virus caused swelling and  fluid to build up around the heart   Hypertension    no meds   Joint pain    Osteoarthritis    Overweight    Prediabetes    Sciatic nerve pain    SOBOE (shortness of breath on exertion)     Past Surgical History:  Procedure Laterality Date   BREAST BIOPSY Left 12/03/2019   Procedure: LEFT EXCISIONAL BREAST BIOPSY;  Surgeon: Manus Rudd, MD;  Location: [facility];  Service: General;  Laterality: Left;  LMA, PEC BLOCK   BREAST LUMPECTOMY Left 2020   male   COLONOSCOPY  2019   large polyps   NASAL SINUS SURGERY  2018   Periodontal surgery     ROTATOR CUFF REPAIR  05/28/2017   Dr. Rennis Chris   SURGERY SCROTAL / TESTICULAR  1967   UVULECTOMY     WISDOM TOOTH EXTRACTION  1981    FAMHx:  Family History  Problem Relation Age of Onset   Cancer Mother    Stroke Mother    Hyperlipidemia Mother    Hypertension Mother    Coronary artery disease Mother 68       Was a long-term smoker   Heart disease Mother    Obesity Mother    Hypertension Father    Hyperlipidemia Father    Coronary artery disease Father  68       At CABG at age 94    SOCHx:   reports that he has never smoked. He has never used smokeless tobacco. He reports current alcohol use. He reports that he does not use drugs.  ALLERGIES:  Allergies  Allergen Reactions   Atenolol     Other reaction(s): bradycardia Other reaction(s): bradycardia   Doxazosin Mesylate     Other reaction(s): hypotension Other reaction(s): hypotension   Lisinopril     Other reaction(s): itching Other reaction(s): itching   Pravastatin     Other reaction(s): Unknown Other reaction(s): muscle aches   Rosuvastatin     Other reaction(s): myalgias Other reaction(s): myalgias   Zetia [Ezetimibe]     Myalgias - hans ache     ROS: Pertinent items noted in HPI and remainder of comprehensive ROS otherwise negative.  HOME MEDS: Current Outpatient Medications on File Prior to Visit  Medication Sig Dispense Refill    acyclovir (ZOVIRAX) 800 MG tablet Take 1 tablet by mouth 2 (two) times daily as needed.     amLODipine (NORVASC) 2.5 MG tablet Take 2.5 mg  tablet in the afternoon if blood pressure is above 145 systolic (top number of blood pressure) 90 tablet 2   amphetamine-dextroamphetamine (ADDERALL XR) 15 MG 24 hr capsule Take 15 mg by mouth every morning.     cetirizine (ZYRTEC) 10 MG tablet Take 1 tablet by mouth as needed.     Cholecalciferol (VITAMIN D-3) 125 MCG (5000 UT) TABS Take 1 tablet by mouth every other day.     DiazePAM (VALIUM PO) Take 5 mg by mouth as needed.     Evolocumab (REPATHA SURECLICK) 140 MG/ML SOAJ Inject 140 mg into the skin every 14 (fourteen) days. 6 mL 3   fluticasone (FLONASE) 50 MCG/ACT nasal spray Place 1 spray into both nostrils as needed.     GNP MELATONIN PO Take 10 mg by mouth.     HYDROcodone-acetaminophen (NORCO/VICODIN) 5-325 MG tablet Take 1 tablet by mouth every 6 (six) hours as needed for moderate pain.     ipratropium (ATROVENT) 0.06 % nasal spray Place 1 spray into both nostrils daily.     lansoprazole (PREVACID) 30 MG capsule Take 30 mg by mouth daily.     levocetirizine (XYZAL) 5 MG tablet Take 5 mg by mouth every evening.     lubiprostone (AMITIZA) 8 MCG capsule Take 1 capsule (8 mcg total) by mouth daily with breakfast. 30 capsule 0   MAGNESIUM GLYCINATE PO Take 350 mg by mouth at bedtime.     Menatetrenone (VITAMIN K2) 100 MCG TABS Take 100 mg by mouth daily.     midodrine (PROAMATINE) 2.5 MG tablet Take 1 tablet (2.5 mg total) by mouth 3 (three) times daily as needed (For systolic blood pressure less than 95 mmHg.). 270 tablet 2   ondansetron (ZOFRAN-ODT) 8 MG disintegrating tablet Take 1 tablet (8 mg total) by mouth every 8 (eight) hours as needed for nausea or vomiting. 20 tablet 0   PSYLLIUM PO Take by mouth.     tirzepatide (ZEPBOUND) 5 MG/0.5ML Pen Inject 5 mg into the skin once a week. 2 mL 0   No current facility-administered medications on file  prior to visit.    LABS/IMAGING: No results found for this or any previous visit (from the past 48 hours). No results found.  LIPID PANEL:    Component Value Date/Time   CHOL 337 (H) 08/06/2022 0954   TRIG 172 (H) 08/06/2022 1610  HDL 58 08/06/2022 0954   CHOLHDL 5.8 (H) 08/06/2022 0954   LDLCALC 246 (H) 08/06/2022 0954    WEIGHTS: Wt Readings from Last 3 Encounters:  06/06/23 159 lb 9.6 oz (72.4 kg)  05/14/23 150 lb (68 kg)  05/10/23 156 lb (70.8 kg)    VITALS: BP (!) 138/90   Pulse 85   Ht 5\' 6"  (1.676 m)   Wt 159 lb 9.6 oz (72.4 kg)   SpO2 97%   BMI 25.76 kg/m   EXAM: Deferred  EKG: Deferred  ASSESSMENT: Probable familial hyperlipidemia, LDL greater than 190, based on Simon-Broome criteria Multiple family members with high cholesterol and cardiovascular disease in both parents Statin and ezetimibe intolerant-myalgias Zero CAC score (2023)  PLAN: 1.   Donald Hubbard has had a mild increase in his cholesterol probably due to some extended dosing of his Repatha intervals as well as some mild weight Hubbard with decreasing his Zepbound dosing.  He is still pretty well treated.  He will try to see if he can use the Repatha more routinely every 2 weeks.  Continue to work on diet and lifestyle changes.  Will plan follow-up in 1 year with Donald Bridegroom, NP.  Donald Nose, MD, [facility] FACP  [place]  [facility]  Medical Director of the Advanced Lipid Disorders &  Cardiovascular Risk Reduction Clinic Diplomate of the American Board of Clinical Lipidology Attending Cardiologist  Direct Dial: [phone]  Fax: [phone]  Website:  [email or web address] Nicely Makael Stein 06/06/2023, 1:49 PM

## 2023-06-06 NOTE — Telephone Encounter (Signed)
Refill sent to [facility].

## 2023-06-06 NOTE — Patient Instructions (Signed)
Medication Instructions:  Your physician recommends that you continue on your current medications as directed. Please refer to the Current Medication list given to you today.  *If you need a refill on your cardiac medications before your next appointment, please call your pharmacy*  Lab Work: Your physician recommends that you return for lab work in one year: NMR   Follow-Up: At [facility], you and your health needs are our priority.  As part of our continuing mission to provide you with exceptional heart care, we have created designated Provider Care Teams.  These Care Teams include your primary Cardiologist (physician) and Advanced Practice Providers (APPs -  Physician Assistants and Nurse Practitioners) who all work together to provide you with the care you need, when you need it.  We recommend signing up for the patient portal called "MyChart".  Sign up information is provided on this After Visit Summary.  MyChart is used to connect with patients for Virtual Visits (Telemedicine).  Patients are able to view lab/test results, encounter notes, upcoming appointments, etc.  Non-urgent messages can be sent to your provider as well.   To learn more about what you can do with MyChart, go to [email or web address].    Your next appointment:   1 year  Provider:   Eligha Bridegroom, NP

## 2023-06-06 NOTE — Telephone Encounter (Signed)
Informed patient Repatha  has been approved  and sent to [facility]   Patient voiced understanding

## 2023-06-06 NOTE — Telephone Encounter (Signed)
Pharmacy Patient Advocate Encounter  Received notification from [employer] that Prior Authorization for repatha has been APPROVED from 05/25/23 to 06/05/24  Last filled 05/03/23 for 90 days and now it is out of refills per [facility]   PA #/Case ID/Reference #: [id number]

## 2023-06-10 ENCOUNTER — Encounter (HOSPITAL_BASED_OUTPATIENT_CLINIC_OR_DEPARTMENT_OTHER): Payer: Self-pay | Admitting: Internal Medicine

## 2023-06-19 ENCOUNTER — Encounter: Payer: Self-pay | Admitting: Family Medicine

## 2023-06-19 ENCOUNTER — Ambulatory Visit (INDEPENDENT_AMBULATORY_CARE_PROVIDER_SITE_OTHER): Payer: Medicare Other | Admitting: Family Medicine

## 2023-06-19 VITALS — BP 93/64 | HR 104 | Temp 98.0°F | Ht 66.0 in | Wt 150.0 lb

## 2023-06-19 DIAGNOSIS — E669 Obesity, unspecified: Secondary | ICD-10-CM | POA: Diagnosis not present

## 2023-06-19 DIAGNOSIS — G4730 Sleep apnea, unspecified: Secondary | ICD-10-CM | POA: Diagnosis not present

## 2023-06-19 DIAGNOSIS — K5901 Slow transit constipation: Secondary | ICD-10-CM

## 2023-06-19 DIAGNOSIS — Z6824 Body mass index (BMI) 24.0-24.9, adult: Secondary | ICD-10-CM

## 2023-06-19 DIAGNOSIS — R7303 Prediabetes: Secondary | ICD-10-CM

## 2023-06-19 MED ORDER — TIRZEPATIDE-WEIGHT MANAGEMENT 5 MG/0.5ML ~~LOC~~ SOLN: 5.0000 mg | SUBCUTANEOUS | 1 refills | Status: DC

## 2023-06-19 NOTE — Progress Notes (Signed)
 Office: [phone]  /  Fax: [phone]  WEIGHT SUMMARY AND BIOMETRICS  Starting Date: 09/05/22  Starting Weight: 184lb   Weight Lost Since Last Visit: 0lb   Vitals Temp: 98 F (36.7 C) BP: 93/64 Pulse Rate: (!) 104 SpO2: 98 %   Body Composition  Body Fat %: 20.1 % Fat Mass (lbs): 30.2 lbs Muscle Mass (lbs): 114 lbs Total Body Water (lbs): 80.2 lbs Visceral Fat Rating : 11     HPI  Chief Complaint: OBESITY  Donald Hubbard is here to discuss his progress with his obesity treatment plan. He is on the practicing portion control and making smarter food choices, such as increasing vegetables and decreasing simple carbohydrates and states he is following his eating plan approximately 90 % of the time. He states he is exercising 30-60 minutes 2-3 times per week.  Interval History:  Since last office visit he is down 0 lb He has been able to maintain his weight loss with a reduced dose of Zepbound He has a net weight loss of 40 pounds in the past 8 months of medically supervised weight management This is a 21.7% total body weight loss with a reduced calorie diet plus use of Zepbound He is up 3.4 pounds of muscle mass and down 2.4 pounds of body fat since his last visit He has been enjoying outdoor physical activity and has been more consistent with weight training He has seen some slight increase in appetite with reduced his dose of Zepbound from 7.5 down to 5 mg once weekly He is avoiding meal skipping, prioritizing lean protein and fiber with meals and has a good support system at home Has recently retired and plans to visit his aging parents in [place]  Pharmacotherapy: Zepbound 5 mg once weekly injection  PHYSICAL EXAM:  Blood pressure 93/64, pulse (!) 104, temperature 98 F (36.7 C), height 5\' 6"  (1.676 m), weight 150 lb (68 kg), SpO2 98%. Body mass index is 24.21 kg/m.  General: He is overweight, cooperative, alert, well developed, and in no acute distress. PSYCH:  Has normal mood, affect and thought process.   Lungs: Normal breathing effort, no conversational dyspnea.   ASSESSMENT AND PLAN  TREATMENT PLAN FOR OBESITY:  Recommended Dietary Goals  Donald Hubbard is currently in the action stage of change. As such, his goal is to continue weight management plan. He has agreed to practicing portion control and making smarter food choices, such as increasing vegetables and decreasing simple carbohydrates.  Behavioral Intervention  We discussed the following Behavioral Modification Strategies today: increasing fiber rich foods, avoiding skipping meals, increasing water intake , practice mindfulness eating and understand the difference between hunger signals and cravings, avoiding temptations and identifying enticing environmental cues, planning for success, and continue to work on maintaining a reduced calorie state, getting the recommended amount of protein, incorporating whole foods, making healthy choices, staying well hydrated and practicing mindfulness when eating..  Additional resources provided today: NA  Recommended Physical Activity Goals  Donald Hubbard has been advised to work up to 150 minutes of moderate intensity aerobic activity a week and strengthening exercises 2-3 times per week for cardiovascular health, weight loss maintenance and preservation of muscle mass.   He has agreed to Increase the intensity, frequency or duration of strengthening exercises  and Increase the intensity, frequency or duration of aerobic exercises    Pharmacotherapy changes for the treatment of obesity: None  ASSOCIATED CONDITIONS ADDRESSED TODAY  Sleep-disordered breathing His wife has complained of his snoring for several months.  His snoring has improved with his 40 pound weight loss in the past 8 months.  He is interested to see if he has obstructive sleep apnea.  He denies daytime somnolence.  Referral made to Dr. Jerre Simon -     Ambulatory referral to  Pulmonology  Generalized obesity with starting BMI 30.6 He is now on maintenance phase of weight loss.  His body fat percent is excellent at 20%.  He is starting to build back muscle mass.  We have discussed maintaining his body weight around this number.  He is interested in continuing Zepbound 5 mg once weekly injection for maintenance.  His insurance no longer covers Zepbound and this will be moved over to [facility] direct mail order pharmacy -     Tirzepatide-Weight Management; Inject 5 mg into the skin once a week.  Dispense: 2 mL; Refill: 1  Slow transit constipation Slow transit constipation, worsened by Zepbound has improved on Amitiza 8 mcg once daily Continue Amitiza 8 mcg once daily along with increased water intake with at least 64 ounces per day and incorporating more fresh fruits and vegetables  Prediabetes Lab Results  Component Value Date   HGBA1C 5.8 (H) 11/07/2022   Plan to reevaluate A1c at his next visit along with a chemistry panel, magnesium, B12, iron and blood counts     He was informed of the importance of frequent follow up visits to maximize his success with intensive lifestyle modifications for his multiple health conditions.   ATTESTASTION STATEMENTS:  Reviewed by clinician on day of visit: allergies, medications, problem list, medical history, surgical history, family history, social history, and previous encounter notes pertinent to obesity diagnosis.   I have personally spent 30 minutes total time today in preparation, patient care, nutritional counseling and documentation for this visit, including the following: review of clinical lab tests; review of medical tests/procedures/services.      Glennis Brink, DO DABFM, DABOM [facility] [address] [place], [place] [address] [phone]

## 2023-06-20 ENCOUNTER — Telehealth: Payer: Self-pay | Admitting: *Deleted

## 2023-06-20 NOTE — Telephone Encounter (Signed)
 Faxed over referral and office notes to Dr. Rudean Haskell office.

## 2023-07-29 ENCOUNTER — Other Ambulatory Visit: Payer: Self-pay | Admitting: Family Medicine

## 2023-07-29 DIAGNOSIS — E669 Obesity, unspecified: Secondary | ICD-10-CM

## 2023-07-31 ENCOUNTER — Ambulatory Visit (INDEPENDENT_AMBULATORY_CARE_PROVIDER_SITE_OTHER): Payer: Medicare Other | Admitting: Family Medicine

## 2023-07-31 ENCOUNTER — Encounter: Payer: Self-pay | Admitting: Family Medicine

## 2023-07-31 VITALS — BP 117/82 | HR 89 | Temp 98.6°F | Ht 66.0 in | Wt 151.0 lb

## 2023-07-31 DIAGNOSIS — Z6824 Body mass index (BMI) 24.0-24.9, adult: Secondary | ICD-10-CM

## 2023-07-31 DIAGNOSIS — E88819 Insulin resistance, unspecified: Secondary | ICD-10-CM | POA: Diagnosis not present

## 2023-07-31 DIAGNOSIS — R7303 Prediabetes: Secondary | ICD-10-CM

## 2023-07-31 DIAGNOSIS — E669 Obesity, unspecified: Secondary | ICD-10-CM | POA: Diagnosis not present

## 2023-07-31 MED ORDER — TIRZEPATIDE-WEIGHT MANAGEMENT 10 MG/0.5ML ~~LOC~~ SOLN: 10.0000 mg | SUBCUTANEOUS | 0 refills | Status: DC

## 2023-07-31 NOTE — Progress Notes (Signed)
 Office: [phone]  /  Fax: [phone]  WEIGHT SUMMARY AND BIOMETRICS  Starting Date: 09/05/22  Starting Weight: 184lb   Weight Lost Since Last Visit: 0lb   Vitals Temp: 98.6 F (37 C) BP: 117/82 Pulse Rate: 89 SpO2: 98 %   Body Composition  Body Fat %: 20.6 % Fat Mass (lbs): 31.2 lbs Muscle Mass (lbs): 113.8 lbs Total Body Water (lbs): 80.6 lbs Visceral Fat Rating : 11   HPI  Chief Complaint: OBESITY  Donald Hubbard is here to discuss his progress with his obesity treatment plan. He is on the practicing portion control and making smarter food choices, such as increasing vegetables and decreasing simple carbohydrates and states he is following his eating plan approximately 85 % of the time. He states he is exercising 0 minutes 0 times per week.  Interval History:  Since last office visit he is up 1 lb He had to travel to visit his dad and stayed mindful of food choices He has a net weight loss of 33 lb in 11 mos This is a 17.9% TBW loss in the past 11 mos of medically supervised weight management He is happy at this weight and has continued Zepbound for maintenace He is doing well on Zepbound cash pay 5 mg weekly His wife is supportive He plans to increase both cardio and body weigh training exercise  Pharmacotherapy: Zepbound 5 mg weekly  PHYSICAL EXAM:  Blood pressure 117/82, pulse 89, temperature 98.6 F (37 C), height 5\' 6"  (1.676 m), weight 151 lb (68.5 kg), SpO2 98%. Body mass index is 24.37 kg/m.  General: He is healthy appearing cooperative, alert, well developed, and in no acute distress. PSYCH: Has normal mood, affect and thought process.   Lungs: Normal breathing effort, no conversational dyspnea.   ASSESSMENT AND PLAN  TREATMENT PLAN FOR OBESITY:  Recommended Dietary Goals  Nicolai is currently in the action stage of change. As such, his goal is to continue weight management plan. He has agreed to practicing portion control and making smarter  food choices, such as increasing vegetables and decreasing simple carbohydrates.  Behavioral Intervention  We discussed the following Behavioral Modification Strategies today: increasing lean protein intake to established goals, increasing fiber rich foods, increasing water intake , keeping healthy foods at home, decreasing eating out or consumption of processed foods, and making healthy choices when eating convenient foods, planning for success, and continue to work on maintaining a reduced calorie state, getting the recommended amount of protein, incorporating whole foods, making healthy choices, staying well hydrated and practicing mindfulness when eating..  Additional resources provided today: NA  Recommended Physical Activity Goals  Artice has been advised to work up to 150 minutes of moderate intensity aerobic activity a week and strengthening exercises 2-3 times per week for cardiovascular health, weight loss maintenance and preservation of muscle mass.   He has agreed to [name] strengthening exercises with a goal of 2-3 sessions a week  and Start aerobic activity with a goal of 150 minutes a week at moderate intensity.   Pharmacotherapy changes for the treatment of obesity: change to Zepbound 10 mg vial, take 1/2 dose once weekly (5 mg)   ASSOCIATED CONDITIONS ADDRESSED TODAY  Insulin resistance Keisuke had a high LP-IR score of 49 on NMR lipoprofile done 05/31/23 Reduce intake of ETOH Add in both cardio and resistance training exercise for a total of 150+ min/ week  Generalized obesity -     Tirzepatide-Weight Management; Inject 10 mg into the skin once  a week.  Dispense: 2 mL; Refill: 0 Continue Zepbound 5 mg weekly for maintenance (taking 1/2 of the 10 mg dose weekly)  Prediabetes Last A1c 5.8 11/07/22 Did well with weight reduction with limited intake of sugar Has room for improvement with regular exercie Repeat A1c in 3 mos     He was informed of the importance of frequent  follow up visits to maximize his success with intensive lifestyle modifications for his multiple health conditions.   ATTESTASTION STATEMENTS:  Reviewed by clinician on day of visit: allergies, medications, problem list, medical history, surgical history, family history, social history, and previous encounter notes pertinent to obesity diagnosis.   I have personally spent 30 minutes total time today in preparation, patient care, nutritional counseling and education,  and documentation for this visit, including the following: review of most recent clinical lab tests, prescribing medications/ refilling medications, reviewing medical assistant documentation, review and interpretation of bioimpedence results.     Seymour Bars, D.O. DABFM, DABOM [facility] [address] [place], [place] [address] [phone]

## 2023-08-01 MED ORDER — INSULIN SYRINGE 31G X 5/16" 0.5 ML MISC: 1 refills | Status: DC

## 2023-08-16 ENCOUNTER — Other Ambulatory Visit: Payer: Self-pay | Admitting: Family Medicine

## 2023-08-16 DIAGNOSIS — E669 Obesity, unspecified: Secondary | ICD-10-CM

## 2023-08-31 ENCOUNTER — Other Ambulatory Visit: Payer: Self-pay | Admitting: Family Medicine

## 2023-08-31 DIAGNOSIS — K5901 Slow transit constipation: Secondary | ICD-10-CM

## 2023-09-09 ENCOUNTER — Ambulatory Visit: Admitting: Family Medicine

## 2023-09-09 ENCOUNTER — Encounter: Payer: Self-pay | Admitting: Family Medicine

## 2023-09-09 VITALS — BP 117/82 | HR 77 | Temp 98.0°F | Ht 66.0 in | Wt 147.0 lb

## 2023-09-09 DIAGNOSIS — R42 Dizziness and giddiness: Secondary | ICD-10-CM | POA: Diagnosis not present

## 2023-09-09 DIAGNOSIS — E669 Obesity, unspecified: Secondary | ICD-10-CM

## 2023-09-09 DIAGNOSIS — G47411 Narcolepsy with cataplexy: Secondary | ICD-10-CM

## 2023-09-09 DIAGNOSIS — R55 Syncope and collapse: Secondary | ICD-10-CM

## 2023-09-09 DIAGNOSIS — E88819 Insulin resistance, unspecified: Secondary | ICD-10-CM

## 2023-09-09 DIAGNOSIS — G4733 Obstructive sleep apnea (adult) (pediatric): Secondary | ICD-10-CM

## 2023-09-09 DIAGNOSIS — Z6823 Body mass index (BMI) 23.0-23.9, adult: Secondary | ICD-10-CM

## 2023-09-09 MED ORDER — INSULIN SYRINGE 31G X 5/16" 0.5 ML MISC: 1 refills | Status: DC

## 2023-09-09 NOTE — Patient Instructions (Addendum)
 We will run a PA for Zepbound   Move your Zepbound  to 1/2 dose (5 mg) EVERY OTHER WEEK  Hydrate well with water and sugar free electrolytes  Stay active/ especially weight training

## 2023-09-09 NOTE — Progress Notes (Signed)
 Office: [phone]  /  Fax: [phone]  WEIGHT SUMMARY AND BIOMETRICS  Starting Date: 09/05/22  Starting Weight: 184lb   Weight Lost Since Last Visit: 4lb   Vitals Temp: 98 F (36.7 C) BP: 117/82 Pulse Rate: 77 SpO2: 99 %   Body Composition  Body Fat %: 19.9 % Fat Mass (lbs): 29.4 lbs Muscle Mass (lbs): 112 lbs Total Body Water (lbs): 83.8 lbs Visceral Fat Rating : 11   HPI  Chief Complaint: OBESITY  Donald Hubbard is here to discuss his progress with his obesity treatment plan. He is on the practicing portion control and making smarter food choices, such as increasing vegetables and decreasing simple carbohydrates and states he is following his eating plan approximately 90 % of the time. He states he is exercising 0 minutes 0 times per week.  Interval History:  Since last office visit he is down 4 lb He is working on maintaining his weight loss This gives him a net weight loss of 37 pounds in the past 1 year medically supervised weight management That is a 20% total body weight loss using Zepbound  in combination with a reduced calorie healthy diet He has been traveling and under more stress with his mother-in-law's poor health and has not been eating as well He still has some episodes of orthostasis without syncope He has been doing Zepbound  5 mg Q 8-day injections, cash pay He has not been doing as much exercise but plans to add in weight training He did complete a sleep study with [facility], awaiting a CPAP for new diagnosis of mild OSA with hypoxemia  Pharmacotherapy: Zepbound  5 mg Q 8-day injection  PHYSICAL EXAM:  Blood pressure 117/82, pulse 77, temperature 98 F (36.7 C), height 5\' 6"  (1.676 m), weight 147 lb (66.7 kg), SpO2 99%. Body mass index is 23.73 kg/m.  General: He is healthy appearing, cooperative, alert, well developed, and in no acute distress. PSYCH: Has normal mood, affect and thought process.   Lungs: Normal breathing effort, no  conversational dyspnea.   ASSESSMENT AND PLAN  TREATMENT PLAN FOR OBESITY:  Recommended Dietary Goals  Donald Hubbard is currently in the action stage of change. As such, his goal is to continue weight management plan. He has agreed to practicing portion control and making smarter food choices, such as increasing vegetables and decreasing simple carbohydrates. Limit alcohol intake to no more than 1 drink per day  Behavioral Intervention  We discussed the following Behavioral Modification Strategies today: increasing lean protein intake to established goals, increasing fiber rich foods, avoiding skipping meals, increasing water intake , work on meal planning and preparation, identifying sources and decreasing liquid calories, planning for success, and continue to work on maintaining a reduced calorie state, getting the recommended amount of protein, incorporating whole foods, making healthy choices, staying well hydrated and practicing mindfulness when eating..  Additional resources provided today: NA  Recommended Physical Activity Goals  Donald Hubbard has been advised to work up to 150 minutes of moderate intensity aerobic activity a week and strengthening exercises 2-3 times per week for cardiovascular health, weight loss maintenance and preservation of muscle mass.   He has agreed to [name] strengthening exercises with a goal of 2-3 sessions a week   Pharmacotherapy changes for the treatment of obesity: Reduce frequency of Zepbound  to 5 mg q. 14-day injections  ASSOCIATED CONDITIONS ADDRESSED TODAY  Transient total loss of muscle tone Reviewed bioimpedance results with patient.  He has lost additional muscles this past month with  a reduction of physical activity and less protein intake due to stress of traveling.  Overall in the past year he has lost 12 pounds of muscle mass that is a 37 pound total body weight loss.  This is a 32% muscle loss.  Recommend less than 30% muscle loss.  He agrees to  increasing his protein intake with a goal of 85 g/day and weight training 3 days a week.  Generalized obesity He is no longer in the obese range, initially with a BMI of 30 now at 23 with a normal body fat percent of 19.  Recommend keeping his body weight around 150 pounds, adding back in 2 to 3 pounds of muscle weight training.  Will reduce his Zepbound  to 5 mg every other week injections.  He would like to maintain his body weight with low-dose Zepbound .  Postural dizziness with presyncope Unchanged.  Patient had postural dizziness with presyncope at his last cardiology visit in October.  He has been encouraged to increase his intake of water and sugar-free electrolytes.  He has not been consistent with use of electrolytes.  He denies chronic meal skipping.  Encouraged adding in sugar-free electrolyte beverage daily and increasing water intake to at least 64 ounces per day.  Eat on a schedule and avoid meal skipping.  Follow-up with cardiology.  Consider carotid ultrasound given worsening of symptoms with head extension.  Insulin  resistance LP-IR score elevated at 49 on his most recent NMR LipoProfile from 05/31/2023 Done while on Zepbound  for obesity management.  He has been physically active and reduce his intake of added sugar and starches.  OSA (obstructive sleep apnea) New.  Reviewed notes from [facility] dated 08/27/2023.  He had an apnea-hypopnea index of 5.6 with hypoxemia desatted to 70%.  He will be started on his CPAP with nocturnal oxygen.  In addition with his hypertension, he is a good candidate to stay on Zepbound  low-dose to maintain his weight and for OSA.  Other orders -     Insulin  Syringe; Use once daily as directed  Dispense: 90 each; Refill: 1      He was informed of the importance of frequent follow up visits to maximize his success with intensive lifestyle modifications for his multiple health conditions.   ATTESTASTION STATEMENTS:  Reviewed by  clinician on day of visit: allergies, medications, problem list, medical history, surgical history, family history, social history, and previous encounter notes pertinent to obesity diagnosis.   I have personally spent 30 minutes total time today in preparation, patient care, nutritional counseling and education,  and documentation for this visit, including the following: review of most recent clinical lab tests, prescribing medications/ refilling medications, reviewing medical assistant documentation, review and interpretation of bioimpedence results.     Micky Albee, D.O. DABFM, DABOM [facility] [address] [place], [place] [address] [phone]

## 2023-10-16 ENCOUNTER — Ambulatory Visit (INDEPENDENT_AMBULATORY_CARE_PROVIDER_SITE_OTHER): Admitting: Family Medicine

## 2023-10-16 VITALS — BP 133/75 | HR 95 | Temp 98.6°F | Ht 66.0 in | Wt 146.0 lb

## 2023-10-16 DIAGNOSIS — G4733 Obstructive sleep apnea (adult) (pediatric): Secondary | ICD-10-CM | POA: Diagnosis not present

## 2023-10-16 DIAGNOSIS — E559 Vitamin D deficiency, unspecified: Secondary | ICD-10-CM

## 2023-10-16 DIAGNOSIS — R634 Abnormal weight loss: Secondary | ICD-10-CM

## 2023-10-16 DIAGNOSIS — R7303 Prediabetes: Secondary | ICD-10-CM

## 2023-10-16 DIAGNOSIS — Z8639 Personal history of other endocrine, nutritional and metabolic disease: Secondary | ICD-10-CM | POA: Diagnosis not present

## 2023-10-16 NOTE — Progress Notes (Signed)
 Office: [phone]  /  Fax: [phone]  WEIGHT SUMMARY AND BIOMETRICS  Starting Date: 09/05/22  Starting Weight: 184lb   Weight Lost Since Last Visit: 1lb   Vitals Temp: 98.6 F (37 C) BP: 133/75 Pulse Rate: 95 SpO2: 99 %   Body Composition  Body Fat %: 20.3 % Fat Mass (lbs): 29.6 lbs Muscle Mass (lbs): 110.6 lbs Total Body Water (lbs): 82.2 lbs Visceral Fat Rating : 11     HPI  Chief Complaint: OBESITY  Nyzir is here to discuss his progress with his obesity treatment plan. He is on the practicing portion control and making smarter food choices, such as increasing vegetables and decreasing simple carbohydrates and states he is following his eating plan approximately 90 % of the time. He states he is exercising 30 minutes 3 times per week.  Interval History:  Since last office visit he is down 1 lb He is down 1.4 lb of muscle mass and up 0.2 lb of body fat in the past month He now has a net weight loss of 38 lb in the past 13 mos of medically supervised weight management which is a 20% TBW loss He has mild OSA, HLD and PDM He started CPAP, still getting used to his CPAP He reduced Zepbound  to 5 mg q 14 days for maintenance of weight loss He is doing more resistance training, less cardio lately He is still mindful of food choices and portion sizes  Pharmacotherapy: Zepbound  5 mg q 14 days  PHYSICAL EXAM:  Blood pressure 133/75, pulse 95, temperature 98.6 F (37 C), height 5' 6 (1.676 m), weight 146 lb (66.2 kg), SpO2 99%. Body mass index is 23.57 kg/m.  General: He is healthy appearing,  cooperative, alert, well developed, and in no acute distress. PSYCH: Has normal mood, affect and thought process.   Lungs: Normal breathing effort, no conversational dyspnea.  ASSESSMENT AND PLAN  TREATMENT PLAN FOR OBESITY:  Recommended Dietary Goals  Dekota is currently in the action stage of change. As such, his goal is to continue weight management plan. He  has agreed to practicing portion control and making smarter food choices, such as increasing vegetables and decreasing simple carbohydrates.  Behavioral Intervention  We discussed the following Behavioral Modification Strategies today: increasing lean protein intake to established goals, increasing fiber rich foods, increasing water intake , keeping healthy foods at home, continue to practice mindfulness when eating, and continue to work on maintaining a reduced calorie state, getting the recommended amount of protein, incorporating whole foods, making healthy choices, staying well hydrated and practicing mindfulness when eating..  Additional resources provided today: NA  Recommended Physical Activity Goals  Oscar has been advised to work up to 150 minutes of moderate intensity aerobic activity a week and strengthening exercises 2-3 times per week for cardiovascular health, weight loss maintenance and preservation of muscle mass.   He has agreed to Increase the intensity, frequency or duration of strengthening exercises  and Increase the intensity, frequency or duration of aerobic exercises    Pharmacotherapy changes for the treatment of obesity: stop Zepbound   ASSOCIATED CONDITIONS ADDRESSED TODAY  Prediabetes Lab Results  Component Value Date   HGBA1C 5.8 (H) 11/07/2022   Repeat A1c today Has done great with lifestyle changes, weight loss and use of Zepbound  -     Hemoglobin A1c  Vitamin D  deficiency Last vitamin D  Lab Results  Component Value Date   VD25OH 85.2 09/05/2022   He has stopped his Vitamin D  supplement  Energy level is good Repeat lab today -     VITAMIN D  25 Hydroxy (Vit-D Deficiency, Fractures)  Weight loss -     Comprehensive metabolic panel with GFR  History of obesity He is at goal weight with BMI and body fat % He is ready to stop Zepbound  Continue to work on healthy eating with a higher fiber/ higher protein diet  Reviewed the importance of cardio and  resistance training >150 min/ wk for maintenance of weight loss and cardiovascular fitness  OSA (obstructive sleep apnea) Starting CPAP nightly for mild OSA, getting used to mask and working on improving sleep to 7 to 8 hours a night     He was informed of the importance of frequent follow up visits to maximize his success with intensive lifestyle modifications for his multiple health conditions.   ATTESTASTION STATEMENTS:  Reviewed by clinician on day of visit: allergies, medications, problem list, medical history, surgical history, family history, social history, and previous encounter notes pertinent to obesity diagnosis.   I have personally spent 30 minutes total time today in preparation, patient care, nutritional counseling and education,  and documentation for this visit, including the following: review of most recent clinical lab tests, reviewing medical assistant documentation, review and interpretation of bioimpedence results.     Darice Haddock, D.O. DABFM, DABOM [facility] [address] [place], [place] [address] [phone]

## 2023-10-17 ENCOUNTER — Ambulatory Visit: Payer: Self-pay | Admitting: Family Medicine

## 2023-10-17 LAB — COMPREHENSIVE METABOLIC PANEL WITH GFR
ALT: 25 IU/L (ref 0–44)
AST: 21 IU/L (ref 0–40)
Albumin: 4.3 g/dL (ref 3.9–4.9)
Alkaline Phosphatase: 69 IU/L (ref 44–121)
BUN/Creatinine Ratio: 13 (ref 10–24)
BUN: 14 mg/dL (ref 8–27)
Bilirubin Total: 0.4 mg/dL (ref 0.0–1.2)
CO2: 20 mmol/L (ref 20–29)
Calcium: 9.1 mg/dL (ref 8.6–10.2)
Chloride: 104 mmol/L (ref 96–106)
Creatinine, Ser: 1.11 mg/dL (ref 0.76–1.27)
Globulin, Total: 1.9 g/dL (ref 1.5–4.5)
Glucose: 137 mg/dL — ABNORMAL HIGH (ref 70–99)
Potassium: 3.8 mmol/L (ref 3.5–5.2)
Sodium: 143 mmol/L (ref 134–144)
Total Protein: 6.2 g/dL (ref 6.0–8.5)
eGFR: 74 mL/min/{1.73_m2} (ref >59)

## 2023-10-17 LAB — HEMOGLOBIN A1C
Est. average glucose Bld gHb Est-mCnc: 105 mg/dL
Hgb A1c MFr Bld: 5.3 % (ref 4.8–5.6)

## 2023-10-17 LAB — VITAMIN D 25 HYDROXY (VIT D DEFICIENCY, FRACTURES): Vit D, 25-Hydroxy: 100 ng/mL (ref 30.0–100.0)

## 2023-10-22 NOTE — Telephone Encounter (Signed)
-----   Message from Darice FORBES Haddock sent at 10/17/2023  4:31 PM EDT ----- Your labs show good kidney and liver function. You no longer have prediabetes!  Vitamin D  is plenty high.  STOP your vitamin D  supplements. Take Care, Dr. MARLA. Bowen  ----- Message ----- From: Rebecka Memos Lab Results In Sent: 10/17/2023   5:38 AM EDT To: Darice FORBES Haddock, DO

## 2023-10-22 NOTE — Telephone Encounter (Signed)
 Lab results to patient. Patient will stop vitamin d  per Dr. Waylan.

## 2023-12-18 ENCOUNTER — Ambulatory Visit: Admitting: Family Medicine

## 2023-12-24 ENCOUNTER — Ambulatory Visit: Admitting: Family Medicine

## 2023-12-25 ENCOUNTER — Encounter: Payer: Self-pay | Admitting: Family Medicine

## 2023-12-25 ENCOUNTER — Ambulatory Visit (INDEPENDENT_AMBULATORY_CARE_PROVIDER_SITE_OTHER): Admitting: Family Medicine

## 2023-12-25 VITALS — BP 137/98 | HR 89 | Temp 98.0°F | Ht 66.0 in | Wt 154.0 lb

## 2023-12-25 DIAGNOSIS — Z8639 Personal history of other endocrine, nutritional and metabolic disease: Secondary | ICD-10-CM

## 2023-12-25 DIAGNOSIS — R7303 Prediabetes: Secondary | ICD-10-CM

## 2023-12-25 DIAGNOSIS — Z6826 Body mass index (BMI) 26.0-26.9, adult: Secondary | ICD-10-CM | POA: Diagnosis not present

## 2023-12-25 DIAGNOSIS — G4733 Obstructive sleep apnea (adult) (pediatric): Secondary | ICD-10-CM | POA: Diagnosis not present

## 2023-12-25 MED ORDER — PEN NEEDLES 32G X 4 MM MISC: 1 refills | Status: DC

## 2023-12-25 MED ORDER — LIRAGLUTIDE 18 MG/3ML ~~LOC~~ SOPN: PEN_INJECTOR | SUBCUTANEOUS | 1 refills | Status: DC

## 2023-12-25 NOTE — Progress Notes (Signed)
 Office: [phone]  /  Fax: [phone]  WEIGHT SUMMARY AND BIOMETRICS  Starting Date: 09/05/22  Starting Weight: 184lb   Weight Lost Since Last Visit: 0lb   Vitals Temp: 98 F (36.7 C) BP: (!) 137/98 Pulse Rate: 89 SpO2: 99 %   Body Composition  Body Fat %: 21.3 % Fat Mass (lbs): 32.8 lbs Muscle Mass (lbs): 115.2 lbs Total Body Water (lbs): 81 lbs Visceral Fat Rating : 12    HPI  Chief Complaint: OBESITY  Donald Hubbard is here to discuss his progress with his obesity treatment plan. He is on the practicing portion control and making smarter food choices, such as increasing vegetables and decreasing simple carbohydrates and states he is following his eating plan approximately 80 % of the time. He states he is exercising 30-60 minutes 2 times per week.  Interval History:  Since last office visit he is up 8 lb He stopped Zepbound  a month ago Appetite did increase off Zebpound Remains mindful of food choices and portion sizes but is snacking more He has a net weight loss of 30 lb in 15 months His mother in law passed away He hasn't been surfing as much this summer  Pharmacotherapy: none  PHYSICAL EXAM:  Blood pressure (!) 137/98, pulse 89, temperature 98 F (36.7 C), height 5' 6 (1.676 m), weight 154 lb (69.9 kg), SpO2 99%. Body mass index is 24.86 kg/m.  General: He is healthy appearing,  cooperative, alert, well developed, and in no acute distress. PSYCH: Has normal mood, affect and thought process.   Lungs: Normal breathing effort, no conversational dyspnea.  ASSESSMENT AND PLAN  TREATMENT PLAN FOR OBESITY:  Recommended Dietary Goals  Sohail is currently in the action stage of change. As such, his goal is to continue weight management plan. He has agreed to practicing portion control and making smarter food choices, such as increasing vegetables and decreasing simple carbohydrates.  Behavioral Intervention  We discussed the following Behavioral  Modification Strategies today: increasing lean protein intake to established goals, increasing fiber rich foods, increasing water intake , work on meal planning and preparation, keeping healthy foods at home, avoiding temptations and identifying enticing environmental cues, and planning for success.  Additional resources provided today: NA  Recommended Physical Activity Goals  Kaenan has been advised to work up to 150 minutes of moderate intensity aerobic activity a week and strengthening exercises 2-3 times per week for cardiovascular health, weight loss maintenance and preservation of muscle mass.   He has agreed to [name] strengthening exercises with a goal of 2-3 sessions a week  and Increase the intensity, frequency or duration of aerobic exercises    Pharmacotherapy changes for the treatment of obesity:  begin Liraglutide  ramping dose, cash pay  ASSOCIATED CONDITIONS ADDRESSED TODAY  Prediabetes Lab Results  Component Value Date   HGBA1C 5.3 10/16/2023   Improved with lifestyle changes and weight loss Did well on Zepbound  but has been off x 1 month Plan to repeat A1c in December -     Liraglutide ; 0.6 mg [place] injection daily x 7 days then increase to 1.2 mg [place] injection daily x 7 days then increase to 1.8 mg daily  Dispense: 6 mL; Refill: 1  OSA (obstructive sleep apnea) Hx of mild OSA Has done well with sleep hygiene and weight reduction -     Liraglutide ; 0.6 mg [place] injection daily x 7 days then increase to 1.2 mg [place] injection daily x 7 days then increase to 1.8 mg daily  Dispense: 6 mL; Refill: 1  History of obesity Improved Did see weight regain off of Zepbound , actively working to maintain his body weight at 150 lb Discussed the importance of 150+ min of moderate intensity exercise weekly, keeping kcal ~1800 per day and will use Liraglutide  starting at 0.6 mg daily injection for maintenance Other orders -     Pen Needles; Use once daily as directed  Dispense: 90 each; Refill:  1      He was informed of the importance of frequent follow up visits to maximize his success with intensive lifestyle modifications for his multiple health conditions.   ATTESTASTION STATEMENTS:  Reviewed by clinician on day of visit: allergies, medications, problem list, medical history, surgical history, family history, social history, and previous encounter notes pertinent to obesity diagnosis.   SABRA Darice Haddock, D.O. DABFM, DABOM [facility] [address] [place], [place] [address] [phone]

## 2024-02-05 ENCOUNTER — Ambulatory Visit: Admitting: Family Medicine

## 2024-02-05 ENCOUNTER — Encounter: Payer: Self-pay | Admitting: Family Medicine

## 2024-02-05 VITALS — BP 135/79 | HR 77 | Temp 98.6°F | Ht 66.0 in | Wt 161.0 lb

## 2024-02-05 DIAGNOSIS — G4733 Obstructive sleep apnea (adult) (pediatric): Secondary | ICD-10-CM | POA: Diagnosis not present

## 2024-02-05 DIAGNOSIS — Z6825 Body mass index (BMI) 25.0-25.9, adult: Secondary | ICD-10-CM

## 2024-02-05 DIAGNOSIS — R635 Abnormal weight gain: Secondary | ICD-10-CM

## 2024-02-05 DIAGNOSIS — K5901 Slow transit constipation: Secondary | ICD-10-CM

## 2024-02-05 DIAGNOSIS — Z8639 Personal history of other endocrine, nutritional and metabolic disease: Secondary | ICD-10-CM

## 2024-02-05 MED ORDER — TIRZEPATIDE-WEIGHT MANAGEMENT 10 MG/0.5ML ~~LOC~~ SOLN: 10.0000 mg | SUBCUTANEOUS | 0 refills | Status: DC

## 2024-02-05 NOTE — Progress Notes (Signed)
 Office: [phone]  /  Fax: [phone]  WEIGHT SUMMARY AND BIOMETRICS  Starting Date: 09/05/22  Starting Weight: 184lb   Weight Lost Since Last Visit: 0lb   Vitals Temp: 98.6 F (37 C) BP: 135/79 Pulse Rate: 77 SpO2: 98 %   Body Composition  Body Fat %: 31.4 % Fat Mass (lbs): 50.6 lbs Muscle Mass (lbs): 105.2 lbs Total Body Water (lbs): 73.4 lbs Visceral Fat Rating : 8     HPI  Chief Complaint: OBESITY  Donald Hubbard is here to discuss his progress with his obesity treatment plan. He is on the practicing portion control and making smarter food choices, such as increasing vegetables and decreasing simple carbohydrates and states he is following his eating plan approximately 85-90 % of the time. He states he is walking and doing work around the house.  Interval History:  Since last office visit he is up 7 lb He did switch up from Zepbound  to Saxenda  1.8 mg daily Appetite has increased Denies adverse SE from Saxenda  Has been more physically active the past month Denies over snacking or poor food choices This gives him a net weight loss of 23 pounds in the past 17 months of medically supervised weight management This is a 12.5% total body weight loss  Pharmacotherapy: Saxenda  1.8 mg once daily injection  PHYSICAL EXAM:  Blood pressure 135/79, pulse 77, temperature 98.6 F (37 C), height 5' 6 (1.676 m), weight 161 lb (73 kg), SpO2 98%. Body mass index is 25.99 kg/m.  General: He is healthy appearing, cooperative, alert, well developed, and in no acute distress. PSYCH: Has normal mood, affect and thought process.   Lungs: Normal breathing effort, no conversational dyspnea.   ASSESSMENT AND PLAN  TREATMENT PLAN FOR OBESITY:  Recommended Dietary Goals  Donald Hubbard is currently in the action stage of change. As such, his goal is to continue weight management plan. He has agreed to practicing portion control and making smarter food choices, such as increasing  vegetables and decreasing simple carbohydrates.  Behavioral Intervention  We discussed the following Behavioral Modification Strategies today: increasing lean protein intake to established goals, increasing fiber rich foods, work on meal planning and preparation, and planning for success.  Additional resources provided today: NA  Recommended Physical Activity Goals  Donald Hubbard has been advised to work up to 150 minutes of moderate intensity aerobic activity a week and strengthening exercises 2-3 times per week for cardiovascular health, weight loss maintenance and preservation of muscle mass.   He has agreed to Increase the intensity, frequency or duration of strengthening exercises  and Increase the intensity, frequency or duration of aerobic exercises    Pharmacotherapy changes for the treatment of obesity: Discontinue Saxenda .  Restart Zepbound  at 10 mg once weekly injection  ASSOCIATED CONDITIONS ADDRESSED TODAY  Weight gain Associated with change of medication from Zepbound  to Saxenda .  He has been more physically active which may have also contributed to increased appetite.  He is focused on lean protein and fiber intake with meals, hydrating well with water and mindful eating.  He would like to keep his body weight around 155 pounds.  History of obesity -     Tirzepatide -Weight Management; Inject 10 mg into the skin once a week.  Dispense: 2 mL; Refill: 0  BMI 25.0-25.9,adult  OSA (obstructive sleep apnea) He has mild OSA.  Continue active plan for weight reduction and improve sleep hygiene  Slow transit constipation Improved on Amitiza  once daily.  Recommend increasing intake of fiber, using  a fiber supplement as needed and MiraLAX 1 scoop daily as needed for constipation associate with Zepbound  restart.     He was informed of the importance of frequent follow up visits to maximize his success with intensive lifestyle modifications for his multiple health  conditions.   ATTESTASTION STATEMENTS:  Reviewed by clinician on day of visit: allergies, medications, problem list, medical history, surgical history, family history, social history, and previous encounter notes pertinent to obesity diagnosis.     Darice Haddock, D.O. DABFM, DABOM [facility] [address] [place], [place] [address] [phone]

## 2024-02-25 ENCOUNTER — Other Ambulatory Visit: Payer: Self-pay | Admitting: Family Medicine

## 2024-02-25 DIAGNOSIS — Z8639 Personal history of other endocrine, nutritional and metabolic disease: Secondary | ICD-10-CM

## 2024-03-30 ENCOUNTER — Ambulatory Visit: Admitting: Family Medicine

## 2024-04-01 ENCOUNTER — Encounter: Payer: Self-pay | Admitting: Family Medicine

## 2024-04-01 ENCOUNTER — Ambulatory Visit: Admitting: Family Medicine

## 2024-04-01 VITALS — BP 149/81 | HR 86 | Temp 98.0°F | Ht 66.0 in | Wt 159.0 lb

## 2024-04-01 DIAGNOSIS — E663 Overweight: Secondary | ICD-10-CM

## 2024-04-01 DIAGNOSIS — Z8639 Personal history of other endocrine, nutritional and metabolic disease: Secondary | ICD-10-CM

## 2024-04-01 DIAGNOSIS — E88819 Insulin resistance, unspecified: Secondary | ICD-10-CM | POA: Diagnosis not present

## 2024-04-01 DIAGNOSIS — Z6825 Body mass index (BMI) 25.0-25.9, adult: Secondary | ICD-10-CM | POA: Diagnosis not present

## 2024-04-01 MED ORDER — TIRZEPATIDE-WEIGHT MANAGEMENT 10 MG/0.5ML ~~LOC~~ SOLN: 10.0000 mg | SUBCUTANEOUS | 0 refills | Status: DC

## 2024-04-01 NOTE — Patient Instructions (Signed)
 Continue Zepbound  -1/2 of the 10 mg vial weekly  Do your best to: Check out local gyms Aim for weight training 2 days/ wk + cardio 30+ min 5 days/ wk  Keep calorie intake ~1500-1700 per day This should include 80+ g of protein daily  Hydrate well with water Remember fruits and veggies daily

## 2024-04-01 NOTE — Progress Notes (Signed)
 Office: [phone]  /  Fax: [phone]  WEIGHT SUMMARY AND BIOMETRICS  Starting Date: 09/05/22  Starting Weight: 184lb   Weight Lost Since Last Visit: 2lb   Vitals Temp: 98 F (36.7 C) BP: (!) 149/81 Pulse Rate: 86 SpO2: 100 %   Body Composition  Body Fat %: 23.3 % Fat Mass (lbs): 37.2 lbs Muscle Mass (lbs): 116.2 lbs Total Body Water (lbs): 82 lbs Visceral Fat Rating : 13    HPI  Chief Complaint: OBESITY  Donald Hubbard is here to discuss his progress with his obesity treatment plan. He is on the practicing portion control and making smarter food choices, such as increasing vegetables and decreasing simple carbohydrates and states he is following his eating plan approximately 90 % of the time. He states he is exercising 0 minutes 0 times per week.  Interval History:  Since last office visit he is down 2 lb He is able to get in his meals restarting Zepbound  at 5 mg weekly Has occasional constipation, struggling to get in enough water He has been less active with weather changes He has more control over food intake with restart Zepbound  vs previous trial of Saxenda  1.8 mg daily injection He is no longer using CPAP for mild OSA He denies meal skipping or GI upset He has a net weight loss of 25 lb in 19 mos mo of medically supervised weight management This is a 13.5% TBW loss  Pharmacotherapy: Zepbound  5 mg weekly (1/2 of a 10 mg vial weekly)   PHYSICAL EXAM:  Blood pressure (!) 149/81, pulse 86, temperature 98 F (36.7 C), height 5' 6 (1.676 m), weight 159 lb (72.1 kg), SpO2 100%. Body mass index is 25.66 kg/m.  General: He is healthy appearing, cooperative, alert, well developed, and in no acute distress. PSYCH: Has normal mood, affect and thought process.   Lungs: Normal breathing effort, no conversational dyspnea.  ASSESSMENT AND PLAN  TREATMENT PLAN FOR OBESITY:  Recommended Dietary Goals  Undrea is currently in the action stage of change. As such, his  goal is to continue weight management plan. He has agreed to practicing portion control and making smarter food choices, such as increasing vegetables and decreasing simple carbohydrates.  Behavioral Intervention  We discussed the following Behavioral Modification Strategies today: increasing lean protein intake to established goals, increasing fiber rich foods, increasing water intake , keeping healthy foods at home, continue to practice mindfulness when eating, planning for success, and celebration eating strategies.  Additional resources provided today: NA  Recommended Physical Activity Goals  Denario has been advised to work up to 150 minutes of moderate intensity aerobic activity a week and strengthening exercises 2-3 times per week for cardiovascular health, weight loss maintenance and preservation of muscle mass.   He has agreed to Increase the intensity, frequency or duration of strengthening exercises  and Increase the intensity, frequency or duration of aerobic exercises   Check out local gym options Aim for both cardio and resistance training for a total of 3-4 days/ wk  Pharmacotherapy changes for the treatment of obesity: none  ASSOCIATED CONDITIONS ADDRESSED TODAY  Insulin  resistance LP-IR score elevated on NMR lipoprofile 05/31/23 with hx of prediabetes Continue to limit excess starch and sugar Focus on lean protein and fiber with meals Ramp up exercise time to 3-4 days/ wk Body weight is at goal   History of obesity -     Tirzepatide -Weight Management; Inject 10 mg into the skin once a week.  Dispense: 2 mL; Refill:  0 Improved with use of Zepbound , injecting 1/2 of a 10 mg vial weekly (5 mg weekly) with improved satiety and without meal skipping or GI upset.  OK to continue at currently dose.  Reminded patient of his progress, just far down 13.5% TBW in 1.5 years with a BMI at goal.  He was regaining last time off of Zepbound  and with trial of Saxenda .    BMI  25.0-25.9,adult  Overweight (BMI 25.0-29.9) Improving Add in resistance training 2 days/ wk to build back lean body mass Keep kcal intake ~1700 daily with 90 g of protein intake daily Plan to move Zepbound  to 5 mg every other week in the next 2 months    He was informed of the importance of frequent follow up visits to maximize his success with intensive lifestyle modifications for his multiple health conditions.   ATTESTASTION STATEMENTS:  Reviewed by clinician on day of visit: allergies, medications, problem list, medical history, surgical history, family history, social history, and previous encounter notes pertinent to obesity diagnosis.   I have personally spent 34 minutes total time today in preparation, patient care, nutritional counseling and education,  and documentation for this visit, including the following: review of most recent clinical lab tests, prescribing medications/ refilling medications, reviewing medical assistant documentation, review and interpretation of bioimpedence results.     Darice Haddock, D.O. DABFM, DABOM [facility] [address] [place], [place] [address] [phone]

## 2024-04-05 IMAGING — CT CT CARDIAC CORONARY ARTERY CALCIUM SCORE
3 series · 14 of 20 positions shown, 16 images · non-contrast
Comparison: None Available.
COMPARISON: None Available.

Addendum:
EXAM:
OVER-READ INTERPRETATION  CT CHEST

The following report is a limited chest CT over-read performed by
09/25/2021. The calcium score interpretation by the cardiologist is
attached.
CLINICAL DATA: Cardiovascular disease risk stratification
CAD screening, low CAD risk hyperlipidemia, hypertension
CT Coronary Calcium Score
TECHNIQUE: A gated, non-contrast computed tomography scan of the heart was
performed using 3mm slice thickness. Axial images were analyzed on a
dedicated workstation. Calcium scoring of the coronary arteries was
performed using the Agatston method.

[Series 2: ax lung · axial · 0.82mm/px · z∈[-270,-158]mm · 5 of 86 slices shown]
[im 15/86  lung]
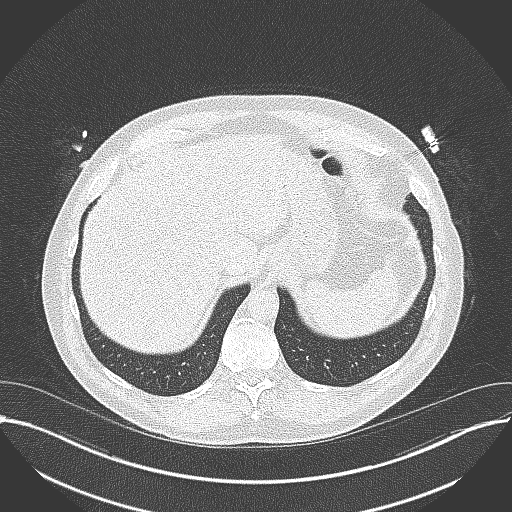
[im 29/86  lung]
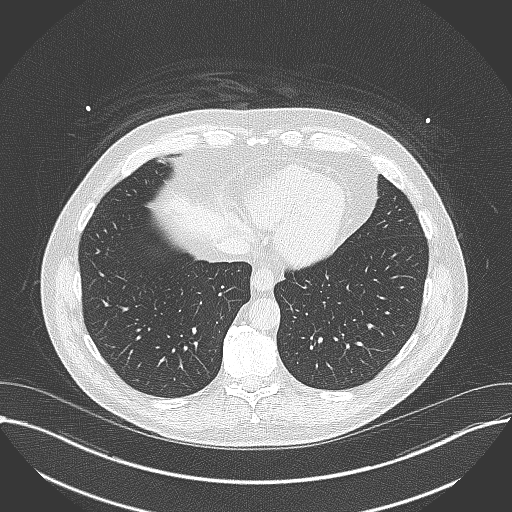
[im 43/86  lung]
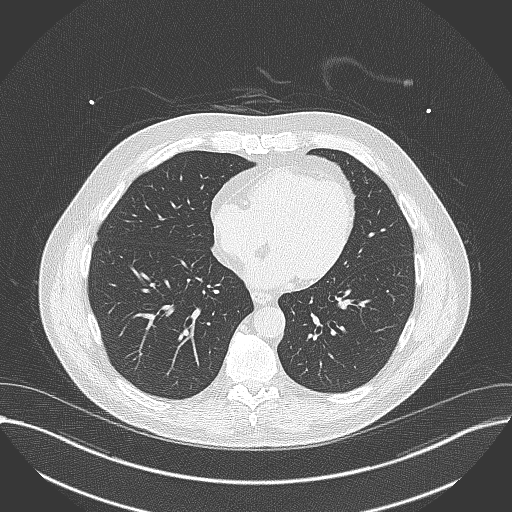
[im 57/86  lung]
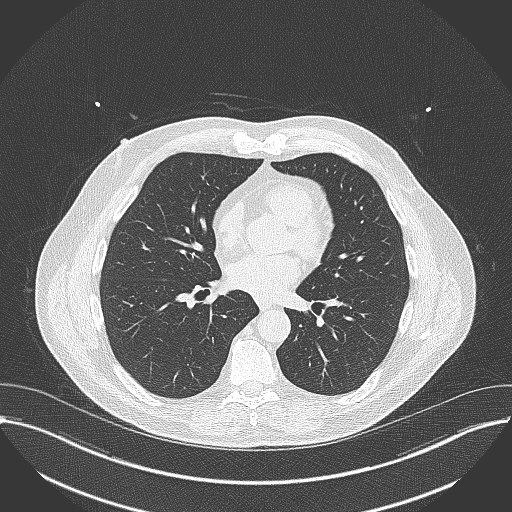
[im 71/86  lung]
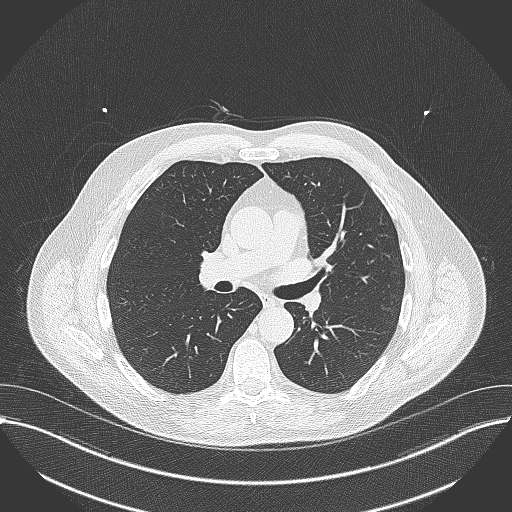

[Series 3: cascseq 3.0 sa36 70% (id) · axial · 0.33mm/px · z∈[-255,-171]mm · 3 of 57 slices shown]
[im 15/57  vessel]
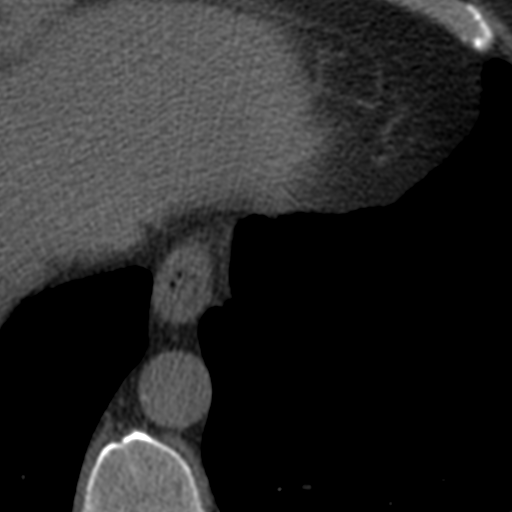
[im 29/57  vessel]
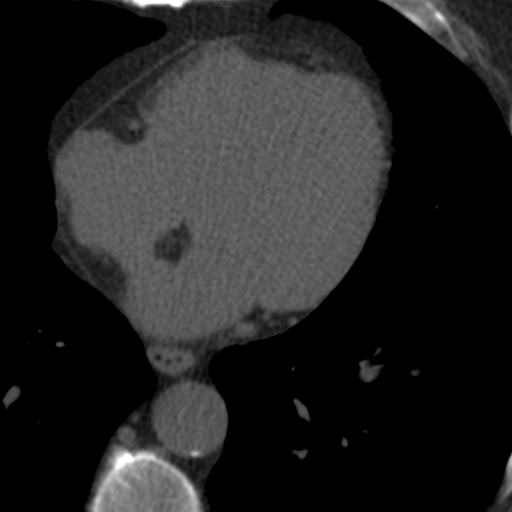
[im 43/57  vessel]
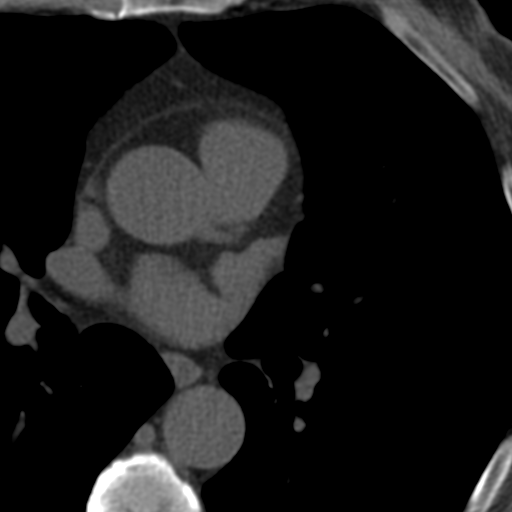

[Series 4: ax st · axial · 0.82mm/px · z∈[-274,-154]mm · 6 of 86 slices shown, 8 images]
[im 13/86  vessel]
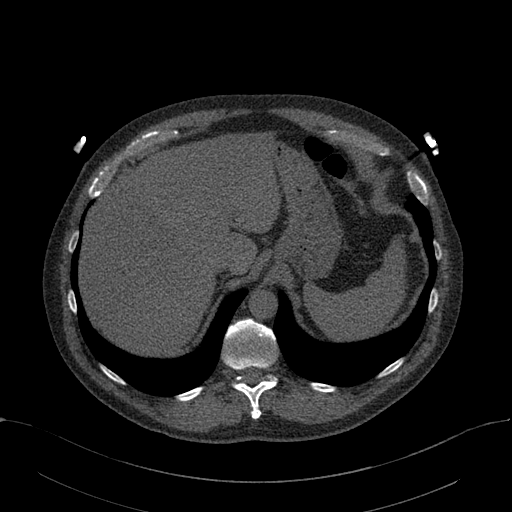
[im 13/86  lung]
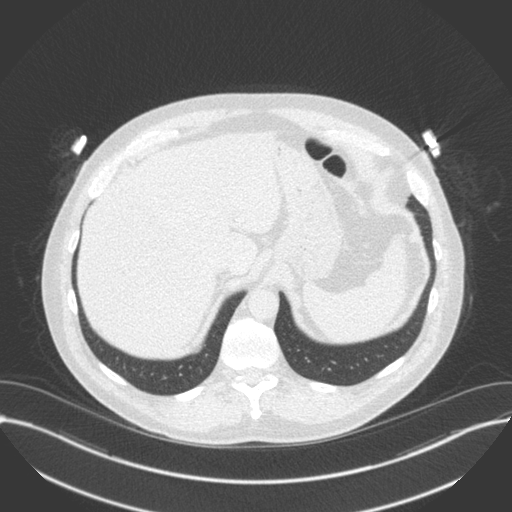
[im 25/86  vessel]
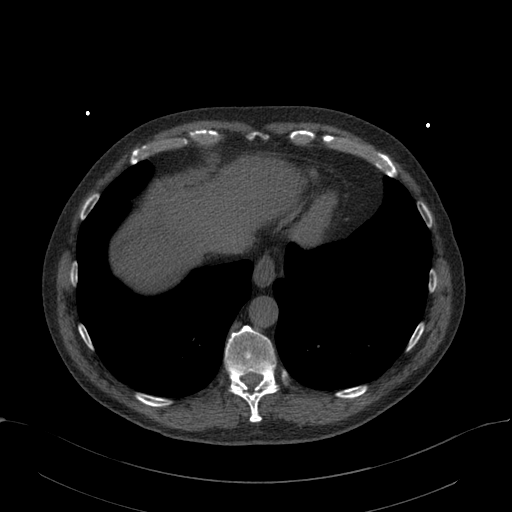
[im 37/86  vessel]
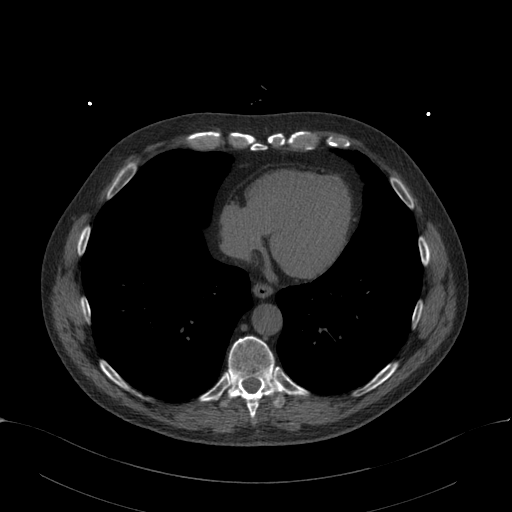
[im 49/86  vessel]
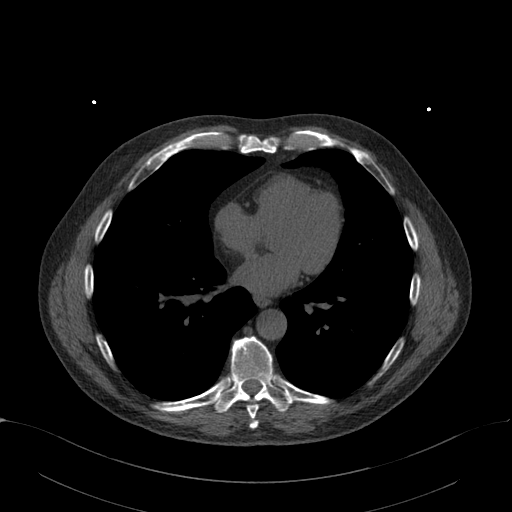
[im 61/86  vessel]
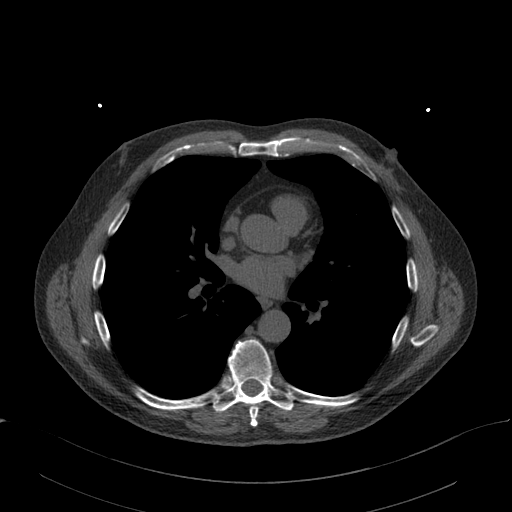
[im 61/86  lung]
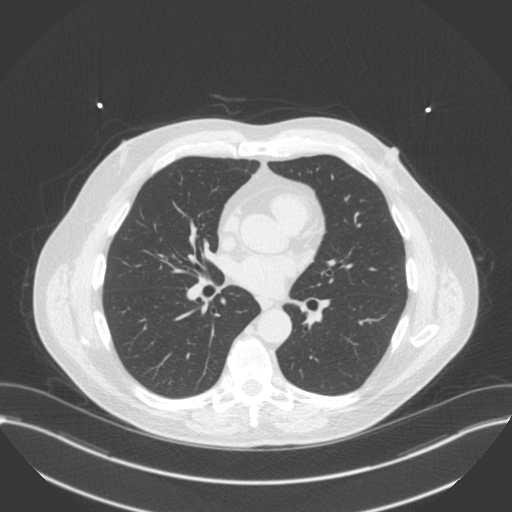
[im 73/86  vessel]
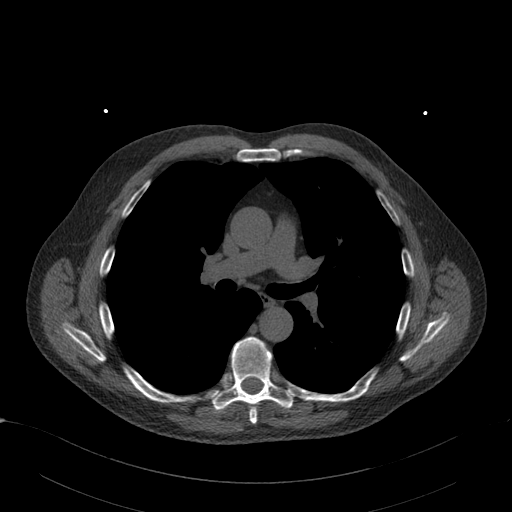

[14 of 20 positions shown; findings below may reference images not displayed]

FINDINGS: Limited view of the lung parenchyma demonstrates a 5 mm nodule in
the RIGHT lower lobe (image [DATE]). Airways are normal.

Limited view of the mediastinum demonstrates no adenopathy.
Esophagus normal.

Limited view of the upper abdomen unremarkable.

Limited view of the skeleton and chest wall is unremarkable.
IMPRESSION: 5 mm RIGHT lobe pulmonary nodule. No follow-up needed if patient is
low-risk.This recommendation follows the consensus statement:
Guidelines for Management of Incidental Pulmonary Nodules Detected
FINDINGS: Coronary Calcium Score:

Left main: 0

Left anterior descending artery: 0

Left circumflex artery: 0

Right coronary artery: 0

Total: 0

Pericardium: Normal.

Ascending Aorta: Normal caliber. Ascending aorta measures
approximately 34mm at the mid ascending aorta measured in an axial
plane.

Non-cardiac: See separate report from [REDACTED].
IMPRESSION: Coronary calcium score of 0.



If CAC=0, it is reasonable to withhold statin therapy and reassess
in 5 to 10 years, as long as higher risk conditions are absent
(diabetes mellitus, family history of premature CHD in first degree
relatives (males <55 years; females <65 years), cigarette smoking,
or LDL >=190 mg/dL).

If CAC is 1 to 99, it is reasonable to initiate statin therapy for
patients >=55 years of age.

If CAC is >=100 or >=75th percentile, it is reasonable to initiate
statin therapy at any age.

Cardiology referral should be considered for patients with CAC
scores >=400 or >=75th percentile.

*5619 AHA/ACC/AACVPR/AAPA/ABC/ZITLE/TIGER/LOCKLEAR/Suleymanov/KAM BIU/ISALM/SAUL ENRIQUE
Guideline on the Management of Blood Cholesterol: A Report of the
American College of Cardiology/American Heart Association Task Force
on Clinical Practice Guidelines. J Am Coll Cardiol.
5477;73(24):7536-7499.

*** End of Addendum ***
EXAM:
OVER-READ INTERPRETATION  CT CHEST

The following report is a limited chest CT over-read performed by
09/25/2021. The calcium score interpretation by the cardiologist is
attached.
FINDINGS: Limited view of the lung parenchyma demonstrates a 5 mm nodule in
the RIGHT lower lobe (image [DATE]). Airways are normal.

Limited view of the mediastinum demonstrates no adenopathy.
Esophagus normal.

Limited view of the upper abdomen unremarkable.

Limited view of the skeleton and chest wall is unremarkable.
IMPRESSION: 5 mm RIGHT lobe pulmonary nodule. No follow-up needed if patient is
low-risk.This recommendation follows the consensus statement:
Guidelines for Management of Incidental Pulmonary Nodules Detected

## 2024-04-27 ENCOUNTER — Encounter: Payer: Self-pay | Admitting: Cardiology

## 2024-05-20 ENCOUNTER — Encounter: Payer: Self-pay | Admitting: Cardiology

## 2024-05-20 ENCOUNTER — Ambulatory Visit: Attending: Cardiology | Admitting: Cardiology

## 2024-05-20 VITALS — BP 122/74 | HR 89 | Ht 66.0 in | Wt 162.8 lb

## 2024-05-20 DIAGNOSIS — R55 Syncope and collapse: Secondary | ICD-10-CM | POA: Insufficient documentation

## 2024-05-20 DIAGNOSIS — T466X5D Adverse effect of antihyperlipidemic and antiarteriosclerotic drugs, subsequent encounter: Secondary | ICD-10-CM | POA: Diagnosis not present

## 2024-05-20 DIAGNOSIS — I479 Paroxysmal tachycardia, unspecified: Secondary | ICD-10-CM | POA: Diagnosis present

## 2024-05-20 DIAGNOSIS — R5382 Chronic fatigue, unspecified: Secondary | ICD-10-CM | POA: Diagnosis present

## 2024-05-20 DIAGNOSIS — T466X5A Adverse effect of antihyperlipidemic and antiarteriosclerotic drugs, initial encounter: Secondary | ICD-10-CM | POA: Diagnosis present

## 2024-05-20 DIAGNOSIS — Z8249 Family history of ischemic heart disease and other diseases of the circulatory system: Secondary | ICD-10-CM | POA: Insufficient documentation

## 2024-05-20 DIAGNOSIS — I10 Essential (primary) hypertension: Secondary | ICD-10-CM | POA: Diagnosis present

## 2024-05-20 DIAGNOSIS — R5383 Other fatigue: Secondary | ICD-10-CM | POA: Insufficient documentation

## 2024-05-20 DIAGNOSIS — G720 Drug-induced myopathy: Secondary | ICD-10-CM | POA: Diagnosis present

## 2024-05-20 DIAGNOSIS — R42 Dizziness and giddiness: Secondary | ICD-10-CM | POA: Diagnosis present

## 2024-05-20 DIAGNOSIS — R0609 Other forms of dyspnea: Secondary | ICD-10-CM | POA: Diagnosis present

## 2024-05-20 DIAGNOSIS — E785 Hyperlipidemia, unspecified: Secondary | ICD-10-CM | POA: Diagnosis present

## 2024-05-20 NOTE — Patient Instructions (Addendum)
 Medication Instructions:  No changes  *If you need a refill on your cardiac medications before your next appointment, please call your pharmacy*   Lab Work: Not needed    Testing/Procedures: Your physician has recommended that you have a cardiopulmonary stress test (CPX). CPX testing is a non-invasive measurement of heart and lung function. It replaces a traditional treadmill stress test. This type of test provides a tremendous amount of information that relates not only to your present condition but also for future outcomes. This test combines measurements of you ventilation, respiratory gas exchange in the lungs, electrocardiogram (EKG), blood pressure and physical response before, during, and following an exercise protocol.   Follow-Up: At [facility], you and your health needs are our priority.  As part of our continuing mission to provide you with exceptional heart care, we have created designated Provider Care Teams.  These Care Teams include your primary Cardiologist (physician) and Advanced Practice Providers (APPs -  Physician Assistants and Nurse Practitioners) who all work together to provide you with the care you need, when you need it.     Your next appointment:   3 month(s)  The format for your next appointment:   In Person  Provider:   Alm Clay, MD   Other Instructions   You are scheduled for a Cardiopulmonary Exercise (CPX) Test as [facility] on: Date:      Time:   Expect to be in the lab for 2 hours. Please plan to arrive 30 minutes prior to your appointment. You may be asked to reschedule your test if you arrive 20 minutes or more after your scheduled appointment time.  Main Campus address: [address] [place], [place] [address] You may arrive to the Main Entrance A or Entrance C (free valet parking is available at both). -Main Entrance A (on [address]) :proceed to admitting for check in -Entrance C (on [employer]): proceed to [employer]  parking or under hospital deck parking using this code _________  Check In: [facility] waiting room (1st floor)   General Instructions for the day of the test (Please follow all instructions from your physician): Refrain from ingesting a heavy meal, alcohol, or caffeine or using tobacco products within 2 hours of the test (DO NOT FAST for mare than 8 hours). You may have all other non-alcoholic, non -caffeinated beverage,a light snack (crackers,a piece of fruit, carrot sticks, toast bagel,etc) up to your appointment. Avoid significant exertion or exercise within 24 hours of your test. Be prepared to exercise and sweat. Your clothing should permit freedom of movement and include walking or running shoes. Women bring loose fitting short sleeved blouse.  This evaluation may be fatiguing and you may wish ti have someone accompany you to the assessment to drive you home afterward. Bring a list of your medications with you, including dosage and frequency you take the medications (  I.e.,once per day, twice per day, etc). Take all medications as prescribed, unless noted below or instructed to do so by your physician.  Please do not take the following medications prior to your CPX:  ________n/a_________________________________________  _________________________________________________  Brief description of the test: A brief lung test will be performed. This will involve you taking deep breaths and blowing hard and fast through your mouth. During these , a clip will be on your nose and you will be breathing through a breathing device.   For the exercise portion of the test you will be walking on a treadmill,  or riding a stationary bike, to your maximal effor or until symptoms such as chest pain, shortness of breath, leg pain or dizziness limit your exercise. You will be breathing in and out of a breathing device through your mouth (a clip will be on your nose again). Your heart rate, ECG,  blood pressure, oxygen saturations, breathing rate and depth, amount of oxygen you consume and amount of carbon dioxide you produce will be measured and monitored throughout the exercise test.  If you need to cancel or reschedule your appointment please call [phone] If you have further questions please call your physician or Josette Pesa, MS, ACSM-RCEP at [phone]

## 2024-05-20 NOTE — Progress Notes (Unsigned)
 " Cardiology Office Note:  .   Date:  05/21/2024  ID:  Donald Hubbard, DOB 04/08/1961, MRN [id number] PCP: Donald Senior, MD  [facility] Providers Cardiologist:  Donald Clay, MD     Chief Complaint  Patient presents with   Follow-up    Annual follow-up.  Noting some exertional dyspnea but otherwise doing well    Patient Profile: .     Donald Hubbard is a formerly obese 64 y.o. male  with a PMH notable for ~Familial Hyperlipidemia with statin intolerance (myalgias, also intolerant of Zetia ) who presents here for annual follow-up at the request of Donald Senior, MD.  Donald Hubbard is being followed by Donald Hubbard for assistance with lipid management-currently on Repatha  and now on Zepbound .  Thankfully, his Coronary Calcium Score was 0.  Donald Hubbard was last seen on May 10, 2023: Doing relatively well other than having occasional postural dizziness after starting Zepbound .  He has lost about 30 pounds and feeling much better.  Was tolerating Repatha .  No angina.  Only notable symptom was intermittent tachycardia.  We discussed adequate hydration and discussed tachycardia symptoms and follow-up with Donald Hubbard.  He then saw Donald Hubbard on June 06, 2023.  Noted slight increase in lipids since previous visit LDL particle was 963 with an LDL of 82.  He was trying to stretch out his Repatha  injections-this was discouraged.  He also had started he reduced his Zepbound  injections due to side effects.  He also gained some weight back as a result.  He recommended going back to every 2 weeks Ranexa     Subjective  Discussed the use of AI scribe software for clinical note transcription with the patient, who gave verbal consent to proceed.  History of Present Illness Donald Hubbard is a 64 year old male who presents with severe fatigue and shortness of breath on exertion.  He experiences severe fatigue and a lack of motivation, significantly impacting his daily activities. He feels tired  quickly during physical activities, such as playing Frisbee, which he used to do for hours but now can only manage for 15-20 minutes before feeling exhausted.  He experiences shortness of breath, particularly during physical activities. He describes difficulty taking deep breaths, a long-standing issue. Shortness of breath is more pronounced when active. No heart palpitations, chest pain, or pressure.  He has a history of a tremor in his lip and chin, which resolved after increasing water intake. However, he continues to experience a tremor in his hand, particularly when holding objects like a phone.  He reports occasional dizziness, especially when looking up while working outside, but has never experienced syncope. He manages this by avoiding looking up for prolonged periods.  He uses Amitiza  as needed for constipation, which he experienced a severe bout of last week. He rarely uses amlodipine  and does not regularly check his blood pressure, but recent readings have been around 124/70 mmHg. He does not use midodrine  despite experiencing dizziness, as he has not had significant issues with low blood pressure.  Cardiovascular ROS: positive for - dyspnea on exertion and occasional dizziness that is more vertiginous in nature.  Limited exertional dyspnea with increased exercise but usually better with taking deep breath. negative for - edema, irregular heartbeat, orthopnea, paroxysmal nocturnal dyspnea, shortness of breath, or syncope or near syncope, TIA or amaurosis fugax, claudication  ROS:  Review of Systems - Negative except symptoms noted above    Objective   Medications: Adderall 30 mg morning and 50  mg p.m. Repatha  140 mg every 2 weeks. tirzepitide (generic Zepbound ) 10 mg weekly PRN: Amlodipine  2.5 mg daily as needed pretension (SBP> 140 mmHg); midodrine  2.5 mg as needed SBP<95 mmHg => has not required either  Studies Reviewed: SABRA   EKG Interpretation Date/Time:  Wednesday May 20 2024 11:36:15 EST Ventricular Rate:  86 PR Interval:  132 QRS Duration:  82 QT Interval:  354 QTC Calculation: 423 R Axis:   -36  Text Interpretation: Normal sinus rhythm Left axis deviation Nonspecific T wave abnormality When compared with ECG of 11-Feb-2023 13:31, Inverted T waves have replaced nonspecific T wave abnormality in Lateral leads Confirmed by Anner Lenis ([id number]) on 05/20/2024 12:19:35 PM    Labs from 10/16/2023: A1c 5.3; glucose 137.  BUN 14, Cr 1.11.  NA 143, K+ four 3.8.  Normal LFTs. NMR panel 05/31/2023: LDL particle #963 with an LDL-C of 82.  HDL-C 57.  TG 197.  TC 197.  Labs slightly elevated.  Discussed by Donald Hubbard in his last note.  Related to altered diet, reduced dosing of both his Zepbound  and Repatha .    Results Radiology Coronary artery calcium score: Zero   Risk Assessment/Calculations:              Physical Exam:   VS:  BP 122/74 (BP Location: Left Arm, Patient Position: Sitting, Cuff Size: Normal)   Pulse 89   Ht 5' 6 (1.676 m)   Wt 162 lb 12.8 oz (73.8 kg)   SpO2 95%   BMI 26.28 kg/m    Wt Readings from Last 3 Encounters:  05/20/24 162 lb 12.8 oz (73.8 kg)  04/01/24 159 lb (72.1 kg)  02/05/24 161 lb (73 kg)     GEN: Well nourished, well groomed; in no acute distress; healthy-appearing NECK: No JVD; No carotid bruits CARDIAC: Normal S1, S2; RRR, no murmurs, rubs, gallops RESPIRATORY:  Clear to auscultation without rales, wheezing or rhonchi ; nonlabored, good air movement. ABDOMEN: Soft, non-tender, non-distended EXTREMITIES:  No edema; No deformity      ASSESSMENT AND PLAN: .   Postural dizziness with presyncope Doing much better.  Now he really has some more dizziness with looking up.  No further syncope.  More vertiginous in nature. Not requiring midodrine .  Essential hypertension Not truly hypertensive.  Has not required amlodipine .  Prefers to avoid medications.  Paroxysmal tachycardia (HCC) Palpitations seem to be doing  little better. Maintaining adequate hydration  Hyperlipidemia with target LDL less than 100 Doing well with Repatha . 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  Now followed by Donald Hubbard and Donald Bane, NP in lipid clinic On Repatha   DOE (dyspnea on exertion) Severe fatigue and exertional dyspnea without chest pain or palpitations. Low coronary artery disease risk. Differential includes cardiac, pulmonary, or deconditioning causes. - Ordered monitored exercise test to assess cardiac and pulmonary function. - Scheduled follow-up in three months to review test results.  Fatigue As much fatigue and exertional dyspnea.  See above   Orders Placed This Encounter  Procedures   Cardiopulmonary exercise test   EKG 12-Lead    No orders of the defined types were placed in this encounter.       Informed Consent   Shared Decision Making/Informed Consent The risks [chest pain, shortness of breath, cardiac arrhythmias, dizziness, blood pressure fluctuations, myocardial infarction, stroke/transient ischemic attack, and life-threatening complications (estimated to be 1  in 10,000)], benefits (risk stratification, diagnosing coronary artery disease, treatment guidance) and alternatives of an exercise tolerance test were discussed in detail with Donald Hubbard and he agrees to proceed.      Follow-Up: Return in 3 months (on 08/18/2024) for 3-4 month follow-up, To discuss test results.    Portions of this note were dictated using DRAGON voice recognition software. Please disregard any errors in transcription. This record has been created using Conservation officer, historic buildings. Errors have been sought and corrected, but may not always be located. Such creation errors do not reflect on the standard of medical care.      Signed, Donald MICAEL Clay, MD, MS Donald Hubbard, M.D., M.S. Interventional Cardiologist  [facility] Pager # [phone]      "

## 2024-05-21 ENCOUNTER — Encounter: Payer: Self-pay | Admitting: Cardiology

## 2024-05-21 ENCOUNTER — Telehealth: Payer: Self-pay | Admitting: *Deleted

## 2024-05-21 NOTE — Telephone Encounter (Signed)
"   Per Dr Donis realized on Mr. Plush, I checked the wrong box -- wanted CPX - NOT Stress PET. Can we change before he gets too far scheduled. DH  "

## 2024-05-21 NOTE — Assessment & Plan Note (Signed)
 Doing well with Repatha . Should be due for routine labs-follow-up with Rosaline Booty, NP at [facility] on 06/03/2024

## 2024-05-21 NOTE — Assessment & Plan Note (Signed)
 Doing much better.  Now he really has some more dizziness with looking up.  No further syncope.  More vertiginous in nature. Not requiring midodrine .

## 2024-05-21 NOTE — Assessment & Plan Note (Signed)
 Not truly hypertensive.  Has not required amlodipine .  Prefers to avoid medications.

## 2024-05-21 NOTE — Telephone Encounter (Signed)
 RN called patient  to inform him of change in test that is needed.  Patient verbalized understanding  New instruction have been sent via mychart

## 2024-05-21 NOTE — Assessment & Plan Note (Signed)
 As much fatigue and exertional dyspnea.  See above

## 2024-05-21 NOTE — Assessment & Plan Note (Signed)
 Intolerant of multiple statins with familial hyperlipidemia.  Now followed by Dr. Mona and Rosaline Bane, NP in lipid clinic On Repatha 

## 2024-05-21 NOTE — Assessment & Plan Note (Signed)
 Severe fatigue and exertional dyspnea without chest pain or palpitations. Low coronary artery disease risk. Differential includes cardiac, pulmonary, or deconditioning causes. - Ordered monitored exercise test to assess cardiac and pulmonary function. - Scheduled follow-up in three months to review test results.

## 2024-05-21 NOTE — Assessment & Plan Note (Signed)
 Palpitations seem to be doing little better. Maintaining adequate hydration

## 2024-05-26 ENCOUNTER — Ambulatory Visit: Admitting: Family Medicine

## 2024-05-26 ENCOUNTER — Encounter: Payer: Self-pay | Admitting: Family Medicine

## 2024-05-26 VITALS — BP 129/76 | HR 97 | Ht 66.0 in | Wt 155.0 lb

## 2024-05-26 DIAGNOSIS — Z8639 Personal history of other endocrine, nutritional and metabolic disease: Secondary | ICD-10-CM | POA: Diagnosis not present

## 2024-05-26 DIAGNOSIS — Z6825 Body mass index (BMI) 25.0-25.9, adult: Secondary | ICD-10-CM | POA: Diagnosis not present

## 2024-05-26 DIAGNOSIS — E88819 Insulin resistance, unspecified: Secondary | ICD-10-CM

## 2024-05-26 DIAGNOSIS — E538 Deficiency of other specified B group vitamins: Secondary | ICD-10-CM

## 2024-05-26 DIAGNOSIS — R7989 Other specified abnormal findings of blood chemistry: Secondary | ICD-10-CM

## 2024-05-26 DIAGNOSIS — R634 Abnormal weight loss: Secondary | ICD-10-CM

## 2024-05-26 DIAGNOSIS — R7303 Prediabetes: Secondary | ICD-10-CM | POA: Diagnosis not present

## 2024-05-26 DIAGNOSIS — E559 Vitamin D deficiency, unspecified: Secondary | ICD-10-CM

## 2024-05-26 MED ORDER — TIRZEPATIDE-WEIGHT MANAGEMENT 10 MG/0.5ML ~~LOC~~ SOLN: 10.0000 mg | SUBCUTANEOUS | 1 refills | Status: AC

## 2024-05-26 NOTE — Patient Instructions (Signed)
 Have fasting labs done, will send results  Can move Zepbound  5 mg to every 10 days over the next month  Ramp up exercise to 4 days/ wk Cardio and weight training  Aim for 8 hrs of sleep at night  Hydrate well with water  Get in more fruits and veggies Add a plain greek yogurt + fresh fruit for lunch instead of meal skipping

## 2024-05-29 LAB — COMPREHENSIVE METABOLIC PANEL WITH GFR
ALT: 36 [IU]/L (ref 0–44)
AST: 25 [IU]/L (ref 0–40)
Albumin: 4.7 g/dL (ref 3.9–4.9)
Alkaline Phosphatase: 70 [IU]/L (ref 47–123)
BUN/Creatinine Ratio: 10 (ref 10–24)
BUN: 13 mg/dL (ref 8–27)
Bilirubin Total: 0.7 mg/dL (ref 0.0–1.2)
CO2: 24 mmol/L (ref 20–29)
Calcium: 9.5 mg/dL (ref 8.6–10.2)
Chloride: 100 mmol/L (ref 96–106)
Creatinine, Ser: 1.24 mg/dL (ref 0.76–1.27)
Globulin, Total: 1.9 g/dL (ref 1.5–4.5)
Glucose: 89 mg/dL (ref 70–99)
Potassium: 4.2 mmol/L (ref 3.5–5.2)
Sodium: 140 mmol/L (ref 134–144)
Total Protein: 6.6 g/dL (ref 6.0–8.5)
eGFR: 65 mL/min/{1.73_m2}

## 2024-05-29 LAB — HEMOGLOBIN A1C
Est. average glucose Bld gHb Est-mCnc: 103 mg/dL
Hgb A1c MFr Bld: 5.2 % (ref 4.8–5.6)

## 2024-05-29 LAB — VITAMIN D 25 HYDROXY (VIT D DEFICIENCY, FRACTURES): Vit D, 25-Hydroxy: 46.7 ng/mL (ref 30.0–100.0)

## 2024-05-29 LAB — INSULIN, RANDOM: INSULIN: 8.4 u[IU]/mL (ref 2.6–24.9)

## 2024-05-29 LAB — VITAMIN B12: Vitamin B-12: 1438 pg/mL — ABNORMAL HIGH (ref 232–1245)

## 2024-06-03 ENCOUNTER — Encounter (HOSPITAL_BASED_OUTPATIENT_CLINIC_OR_DEPARTMENT_OTHER): Admitting: Nurse Practitioner

## 2024-06-04 ENCOUNTER — Encounter (HOSPITAL_COMMUNITY)

## 2024-06-24 ENCOUNTER — Other Ambulatory Visit (HOSPITAL_COMMUNITY)

## 2024-08-19 ENCOUNTER — Ambulatory Visit: Admitting: Cardiology

## 2024-08-25 ENCOUNTER — Ambulatory Visit: Admitting: Family Medicine
